# Patient Record
Sex: Female | Born: 1935 | Race: White | Hispanic: No | Smoking: Never smoker
Health system: Southern US, Community
[De-identification: ages and names within clinical notes are randomized; demographics above are authoritative.]

## PROBLEM LIST (undated history)

## (undated) DIAGNOSIS — M1990 Unspecified osteoarthritis, unspecified site: Secondary | ICD-10-CM

## (undated) DIAGNOSIS — R7302 Impaired glucose tolerance (oral): Secondary | ICD-10-CM

## (undated) DIAGNOSIS — E669 Obesity, unspecified: Secondary | ICD-10-CM

## (undated) DIAGNOSIS — T7840XA Allergy, unspecified, initial encounter: Secondary | ICD-10-CM

## (undated) DIAGNOSIS — E785 Hyperlipidemia, unspecified: Secondary | ICD-10-CM

## (undated) DIAGNOSIS — C44311 Basal cell carcinoma of skin of nose: Secondary | ICD-10-CM

## (undated) DIAGNOSIS — C50919 Malignant neoplasm of unspecified site of unspecified female breast: Secondary | ICD-10-CM

## (undated) DIAGNOSIS — N200 Calculus of kidney: Secondary | ICD-10-CM

## (undated) DIAGNOSIS — D0512 Intraductal carcinoma in situ of left breast: Secondary | ICD-10-CM

## (undated) DIAGNOSIS — I10 Essential (primary) hypertension: Secondary | ICD-10-CM

## (undated) HISTORY — DX: Impaired glucose tolerance (oral): R73.02

## (undated) HISTORY — DX: Obesity, unspecified: E66.9

## (undated) HISTORY — PX: HAMMER TOE SURGERY: SHX385

## (undated) HISTORY — DX: Intraductal carcinoma in situ of left breast: D05.12

## (undated) HISTORY — DX: Essential (primary) hypertension: I10

## (undated) HISTORY — PX: BUNIONECTOMY: SHX129

## (undated) HISTORY — PX: CATARACT EXTRACTION, BILATERAL: SHX1313

## (undated) HISTORY — DX: Unspecified osteoarthritis, unspecified site: M19.90

## (undated) HISTORY — DX: Allergy, unspecified, initial encounter: T78.40XA

## (undated) HISTORY — DX: Hyperlipidemia, unspecified: E78.5

## (undated) HISTORY — DX: Calculus of kidney: N20.0

## (undated) HISTORY — DX: Malignant neoplasm of unspecified site of unspecified female breast: C50.919

## (undated) HISTORY — PX: COLONOSCOPY: SHX174

## (undated) HISTORY — DX: Basal cell carcinoma of skin of nose: C44.311

---

## 1996-04-28 HISTORY — PX: CARDIAC CATHETERIZATION: SHX172

## 1996-04-28 HISTORY — PX: DILATION AND CURETTAGE OF UTERUS: SHX78

## 1997-11-09 ENCOUNTER — Other Ambulatory Visit: Admission: RE | Admit: 1997-11-09 | Discharge: 1997-11-09 | Payer: Self-pay | Admitting: Family Medicine

## 1998-01-29 ENCOUNTER — Other Ambulatory Visit: Admission: RE | Admit: 1998-01-29 | Discharge: 1998-01-29 | Payer: Self-pay | Admitting: Obstetrics and Gynecology

## 1999-01-30 ENCOUNTER — Other Ambulatory Visit: Admission: RE | Admit: 1999-01-30 | Discharge: 1999-01-30 | Payer: Self-pay | Admitting: *Deleted

## 2000-02-05 ENCOUNTER — Encounter: Payer: Self-pay | Admitting: Family Medicine

## 2000-02-05 ENCOUNTER — Encounter: Admission: RE | Admit: 2000-02-05 | Discharge: 2000-02-05 | Payer: Self-pay | Admitting: Family Medicine

## 2001-02-09 ENCOUNTER — Encounter: Payer: Self-pay | Admitting: Family Medicine

## 2001-02-09 ENCOUNTER — Encounter: Admission: RE | Admit: 2001-02-09 | Discharge: 2001-02-09 | Payer: Self-pay | Admitting: Family Medicine

## 2002-02-10 ENCOUNTER — Encounter: Admission: RE | Admit: 2002-02-10 | Discharge: 2002-02-10 | Payer: Self-pay | Admitting: Family Medicine

## 2002-02-10 ENCOUNTER — Encounter: Payer: Self-pay | Admitting: Family Medicine

## 2003-02-13 ENCOUNTER — Encounter: Payer: Self-pay | Admitting: Family Medicine

## 2003-02-13 ENCOUNTER — Encounter: Admission: RE | Admit: 2003-02-13 | Discharge: 2003-02-13 | Payer: Self-pay | Admitting: Family Medicine

## 2004-07-31 ENCOUNTER — Encounter: Admission: RE | Admit: 2004-07-31 | Discharge: 2004-07-31 | Payer: Self-pay | Admitting: Family Medicine

## 2004-08-14 ENCOUNTER — Encounter: Admission: RE | Admit: 2004-08-14 | Discharge: 2004-08-14 | Payer: Self-pay | Admitting: Family Medicine

## 2005-04-28 LAB — HM COLONOSCOPY: HM Colonoscopy: NORMAL

## 2005-08-01 ENCOUNTER — Encounter: Admission: RE | Admit: 2005-08-01 | Discharge: 2005-08-01 | Payer: Self-pay | Admitting: Family Medicine

## 2005-09-09 ENCOUNTER — Encounter: Admission: RE | Admit: 2005-09-09 | Discharge: 2005-09-09 | Payer: Self-pay | Admitting: Family Medicine

## 2005-10-31 ENCOUNTER — Ambulatory Visit: Payer: Self-pay | Admitting: Family Medicine

## 2006-02-17 ENCOUNTER — Ambulatory Visit: Payer: Self-pay | Admitting: Family Medicine

## 2006-08-04 ENCOUNTER — Encounter: Admission: RE | Admit: 2006-08-04 | Discharge: 2006-08-04 | Payer: Self-pay | Admitting: Family Medicine

## 2006-11-04 ENCOUNTER — Ambulatory Visit: Payer: Self-pay | Admitting: Family Medicine

## 2007-02-22 ENCOUNTER — Ambulatory Visit: Payer: Self-pay | Admitting: Family Medicine

## 2007-08-06 ENCOUNTER — Encounter: Admission: RE | Admit: 2007-08-06 | Discharge: 2007-08-06 | Payer: Self-pay | Admitting: Family Medicine

## 2007-11-10 ENCOUNTER — Ambulatory Visit: Payer: Self-pay | Admitting: Family Medicine

## 2008-01-12 ENCOUNTER — Ambulatory Visit: Payer: Self-pay | Admitting: Family Medicine

## 2008-08-07 ENCOUNTER — Encounter: Admission: RE | Admit: 2008-08-07 | Discharge: 2008-08-07 | Payer: Self-pay | Admitting: Family Medicine

## 2008-11-10 ENCOUNTER — Ambulatory Visit: Payer: Self-pay | Admitting: Family Medicine

## 2008-12-14 ENCOUNTER — Ambulatory Visit: Payer: Self-pay | Admitting: Family Medicine

## 2009-01-15 ENCOUNTER — Ambulatory Visit: Payer: Self-pay | Admitting: Family Medicine

## 2009-02-14 ENCOUNTER — Ambulatory Visit: Payer: Self-pay | Admitting: Family Medicine

## 2009-02-14 ENCOUNTER — Encounter: Admission: RE | Admit: 2009-02-14 | Discharge: 2009-02-14 | Payer: Self-pay | Admitting: Family Medicine

## 2009-08-13 ENCOUNTER — Encounter: Admission: RE | Admit: 2009-08-13 | Discharge: 2009-08-13 | Payer: Self-pay | Admitting: Family Medicine

## 2009-09-23 ENCOUNTER — Emergency Department (HOSPITAL_COMMUNITY): Admission: EM | Admit: 2009-09-23 | Discharge: 2009-09-23 | Payer: Self-pay | Admitting: Emergency Medicine

## 2009-11-12 ENCOUNTER — Ambulatory Visit: Payer: Self-pay | Admitting: Family Medicine

## 2010-02-07 ENCOUNTER — Ambulatory Visit: Payer: Self-pay | Admitting: Family Medicine

## 2010-05-16 ENCOUNTER — Ambulatory Visit: Admit: 2010-05-16 | Payer: Self-pay | Admitting: Family Medicine

## 2010-05-19 ENCOUNTER — Encounter: Payer: Self-pay | Admitting: Family Medicine

## 2010-05-27 ENCOUNTER — Other Ambulatory Visit: Payer: Self-pay | Admitting: Family Medicine

## 2010-05-27 DIAGNOSIS — Z780 Asymptomatic menopausal state: Secondary | ICD-10-CM

## 2010-05-27 DIAGNOSIS — M8580 Other specified disorders of bone density and structure, unspecified site: Secondary | ICD-10-CM

## 2010-05-31 ENCOUNTER — Ambulatory Visit
Admission: RE | Admit: 2010-05-31 | Discharge: 2010-05-31 | Disposition: A | Discharge status: Home / Self Care | Payer: MEDICARE | Source: Ambulatory Visit | Attending: Family Medicine | Admitting: Family Medicine

## 2010-05-31 DIAGNOSIS — M8580 Other specified disorders of bone density and structure, unspecified site: Secondary | ICD-10-CM

## 2010-05-31 DIAGNOSIS — Z780 Asymptomatic menopausal state: Secondary | ICD-10-CM

## 2010-06-03 ENCOUNTER — Ambulatory Visit (INDEPENDENT_AMBULATORY_CARE_PROVIDER_SITE_OTHER): Payer: MEDICARE | Admitting: Family Medicine

## 2010-06-03 DIAGNOSIS — R946 Abnormal results of thyroid function studies: Secondary | ICD-10-CM

## 2010-07-16 ENCOUNTER — Other Ambulatory Visit: Payer: Self-pay | Admitting: Family Medicine

## 2010-07-16 DIAGNOSIS — Z1231 Encounter for screening mammogram for malignant neoplasm of breast: Secondary | ICD-10-CM

## 2010-07-18 IMAGING — CR DG LUMBAR SPINE COMPLETE 4+V
5 series · 5 of 5 positions shown · non-contrast
Comparison: None.

CLINICAL DATA: Back pain status post fall.

LUMBAR SPINE - COMPLETE 4+ VIEW

[t l-spine a.p.]
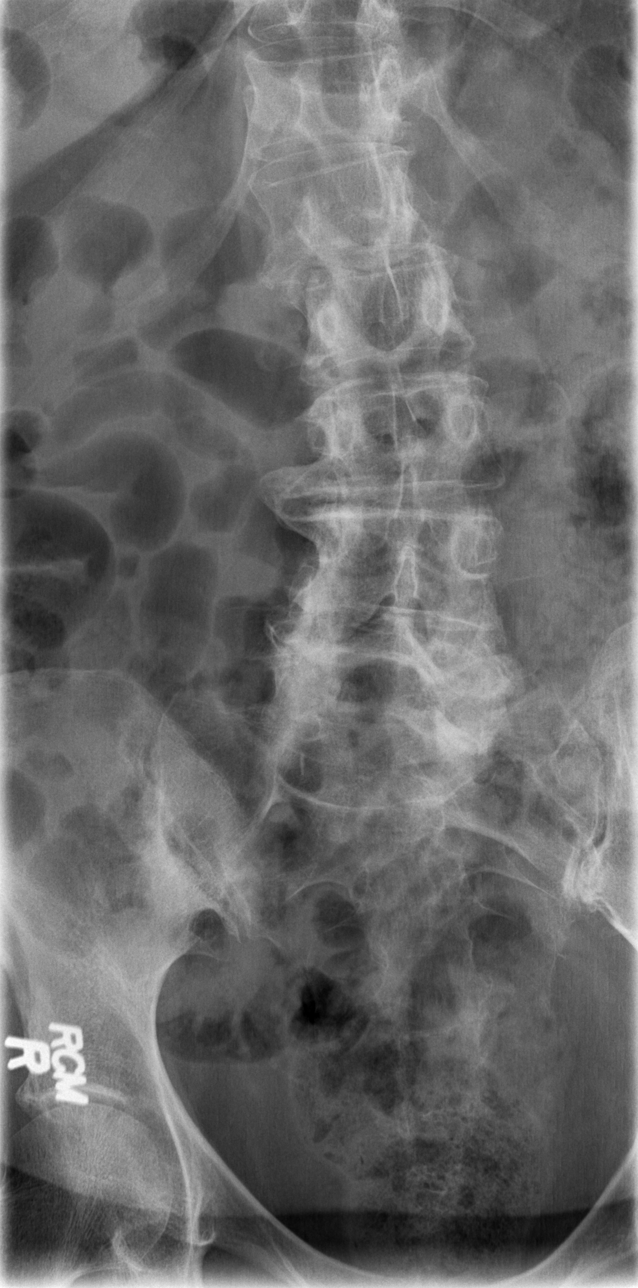

[t l-spine oblique exposure (1 of 2)]
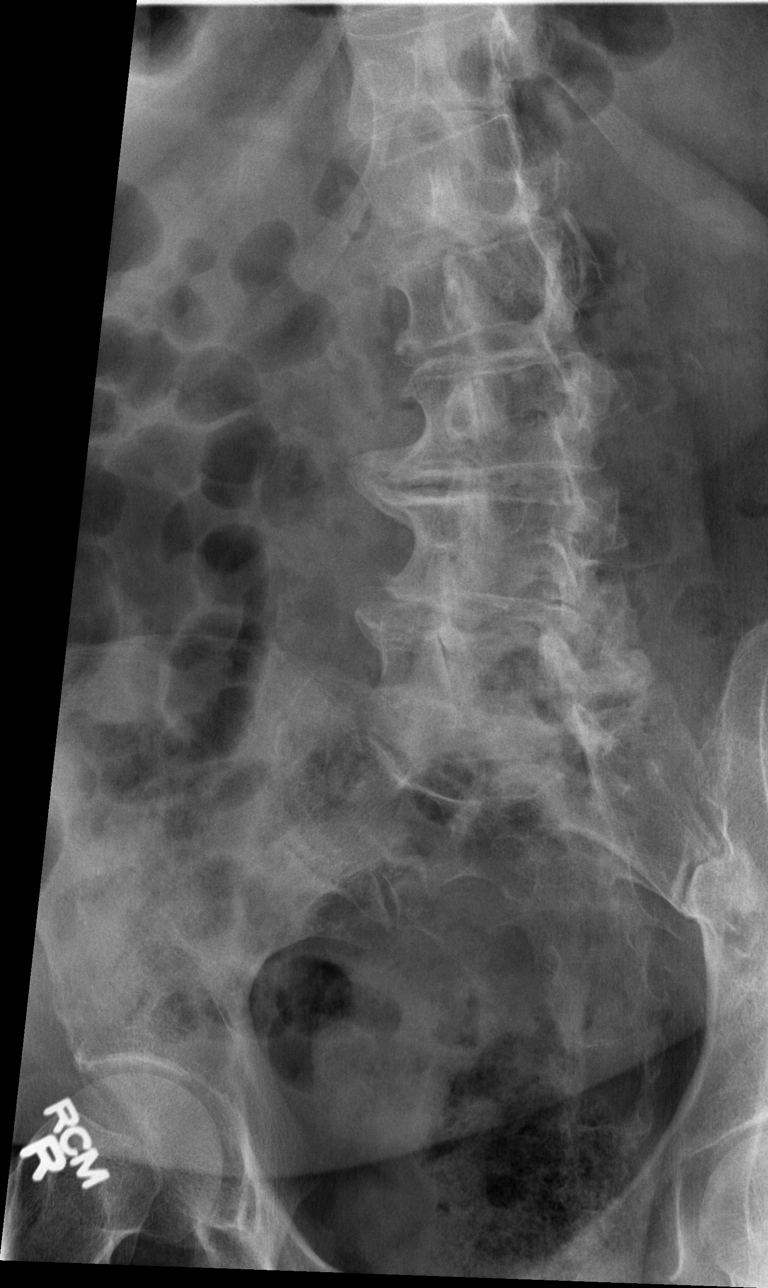

[t l-spine oblique exposure (2 of 2)]
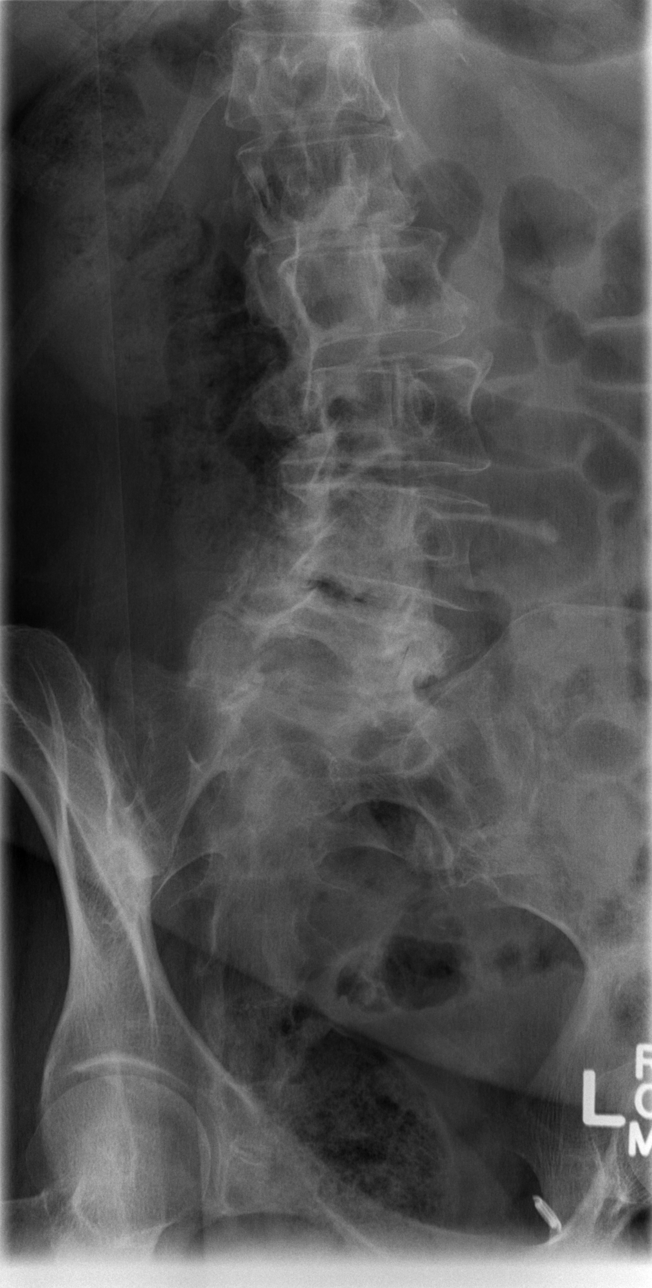

[t l-spine lat]
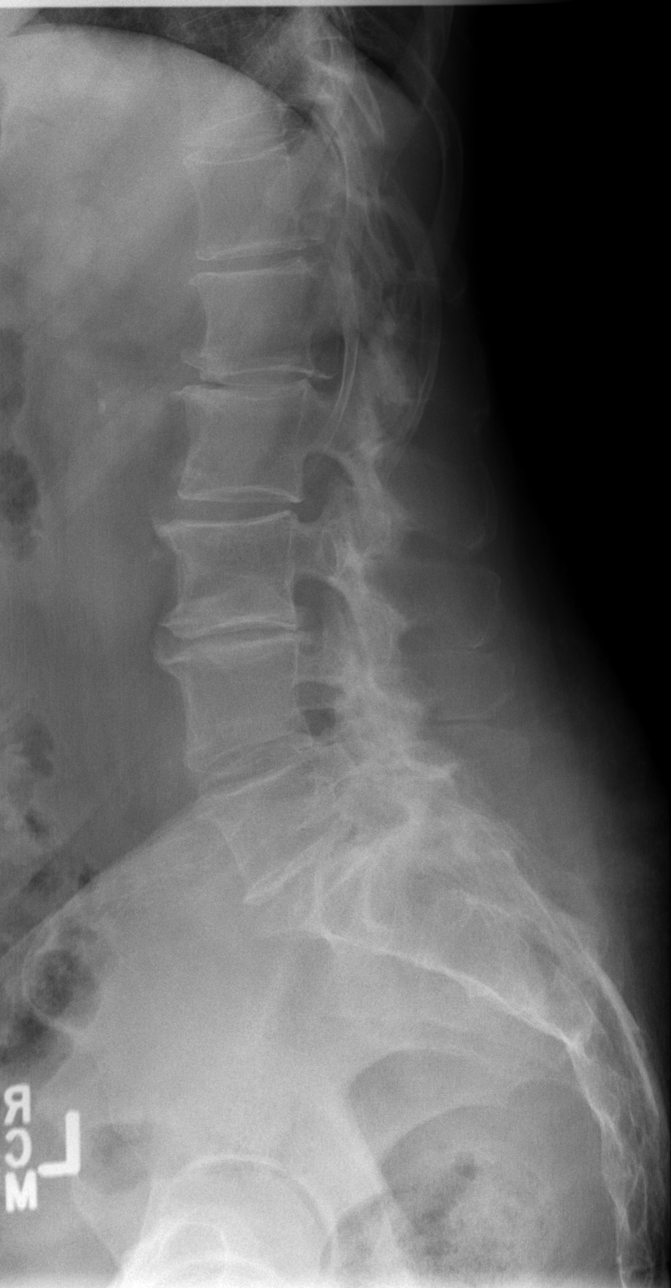

[t l-spine l5-s1 spot]
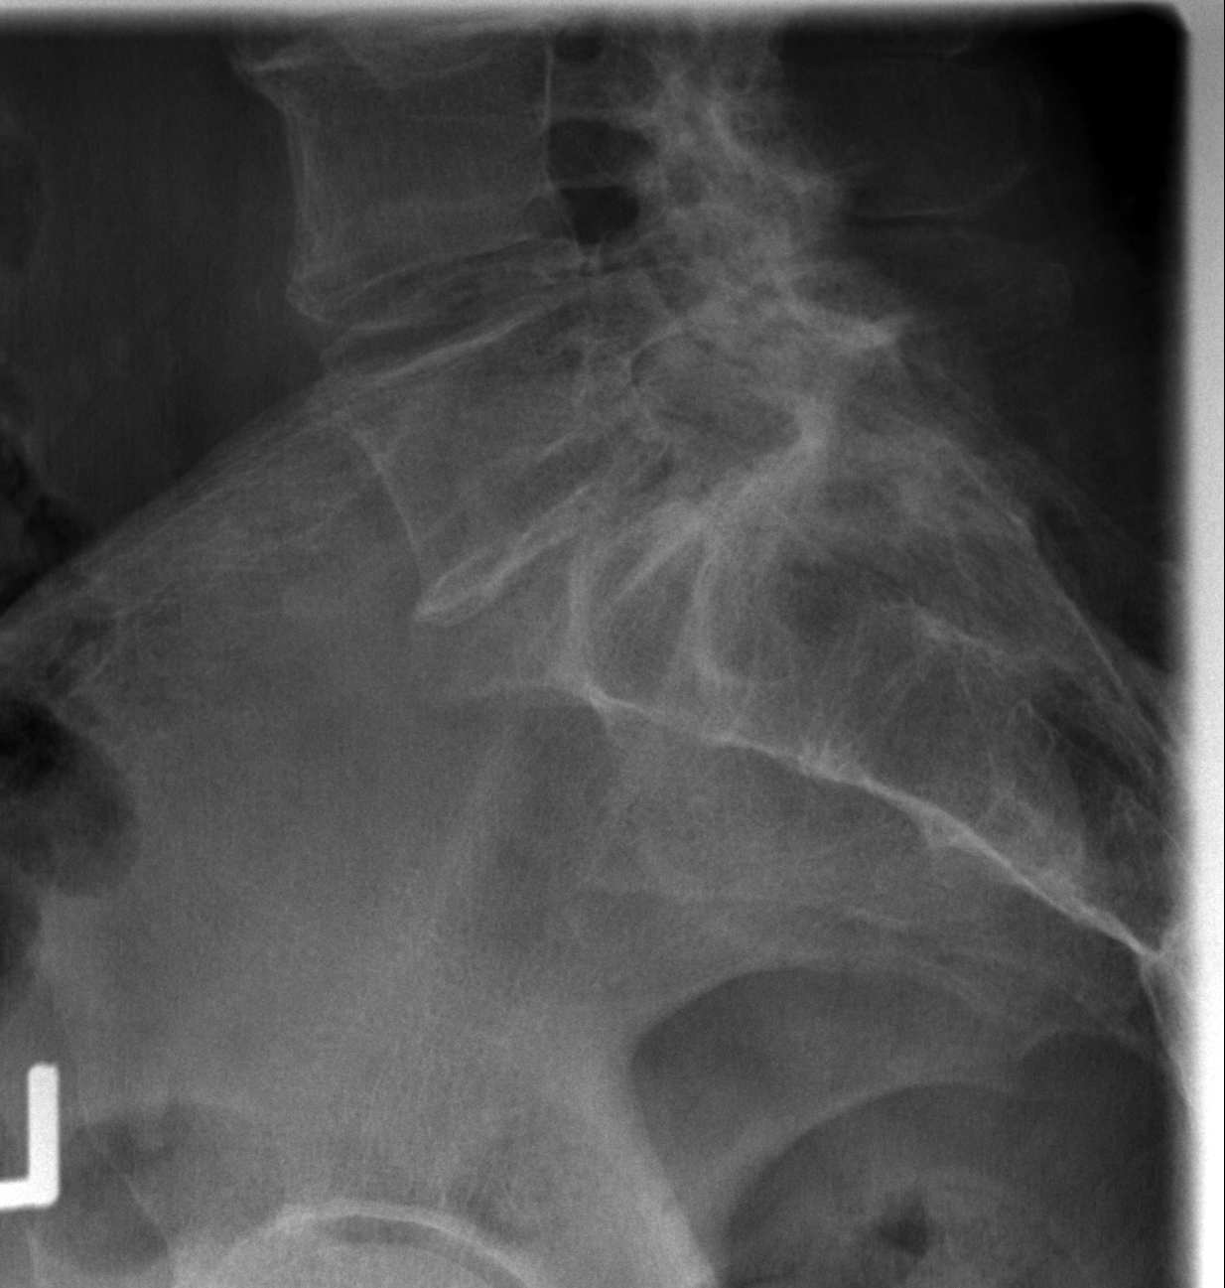

[5 of 5 positions shown; findings below may reference images not displayed]

FINDINGS: There are five lumbar type vertebral bodies.  There is a
convex left scoliosis.  The lateral alignment is near anatomic.
There is multilevel spondylosis with advanced disc space loss,
osteophytes and facet hypertrophy.  No acute fractures are
identified. There is diffuse osteopenia.
IMPRESSION: Multilevel spondylosis.  No acute osseous findings.

## 2010-07-18 IMAGING — CR DG HUMERUS 2V *R*
2 series · 2 of 2 positions shown · non-contrast
Comparison: None relevant.

CLINICAL DATA: Status post fall.  Mid humeral pain.

RIGHT HUMERUS - 2+ VIEW

[w humerus ap right *]
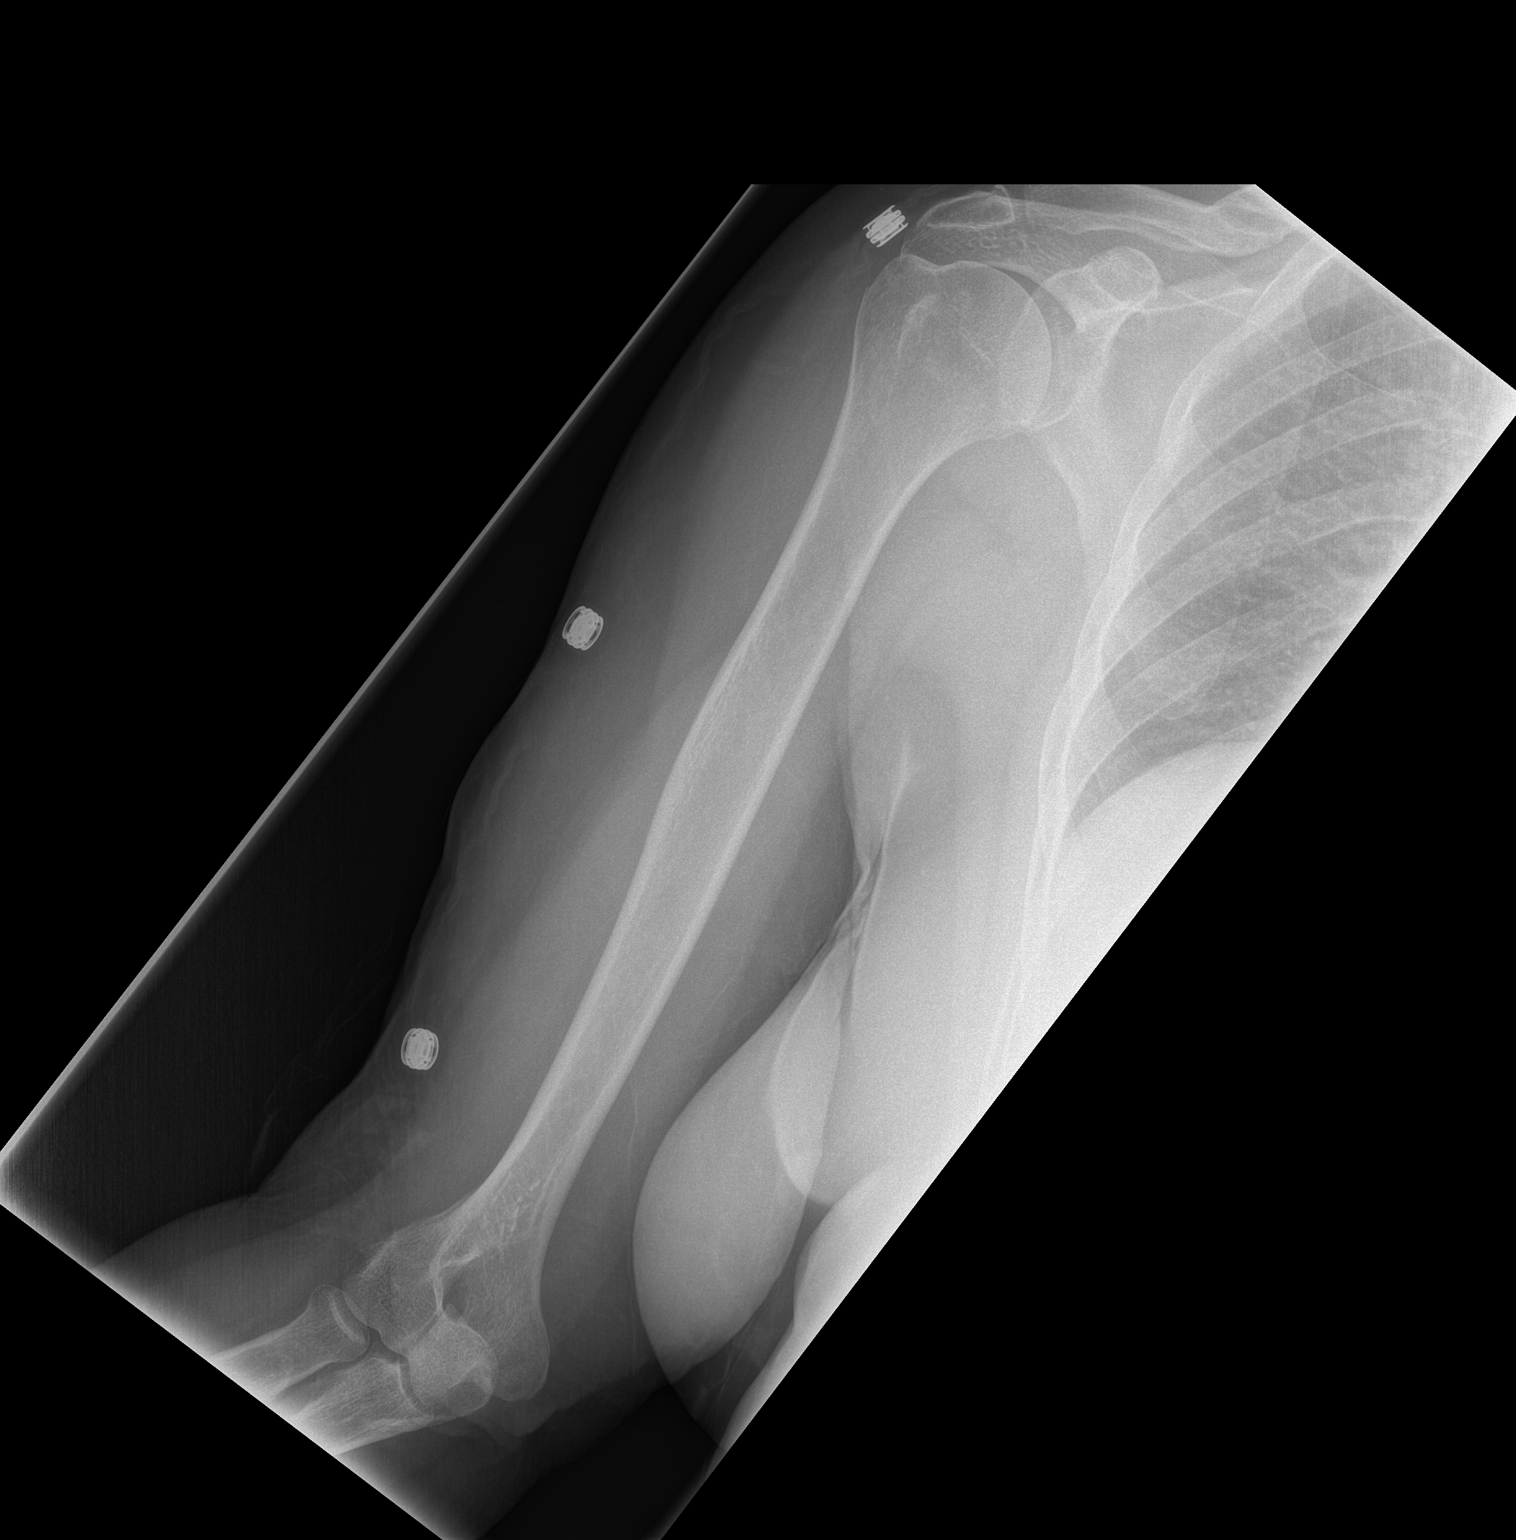

[w shoulder ap external righ]
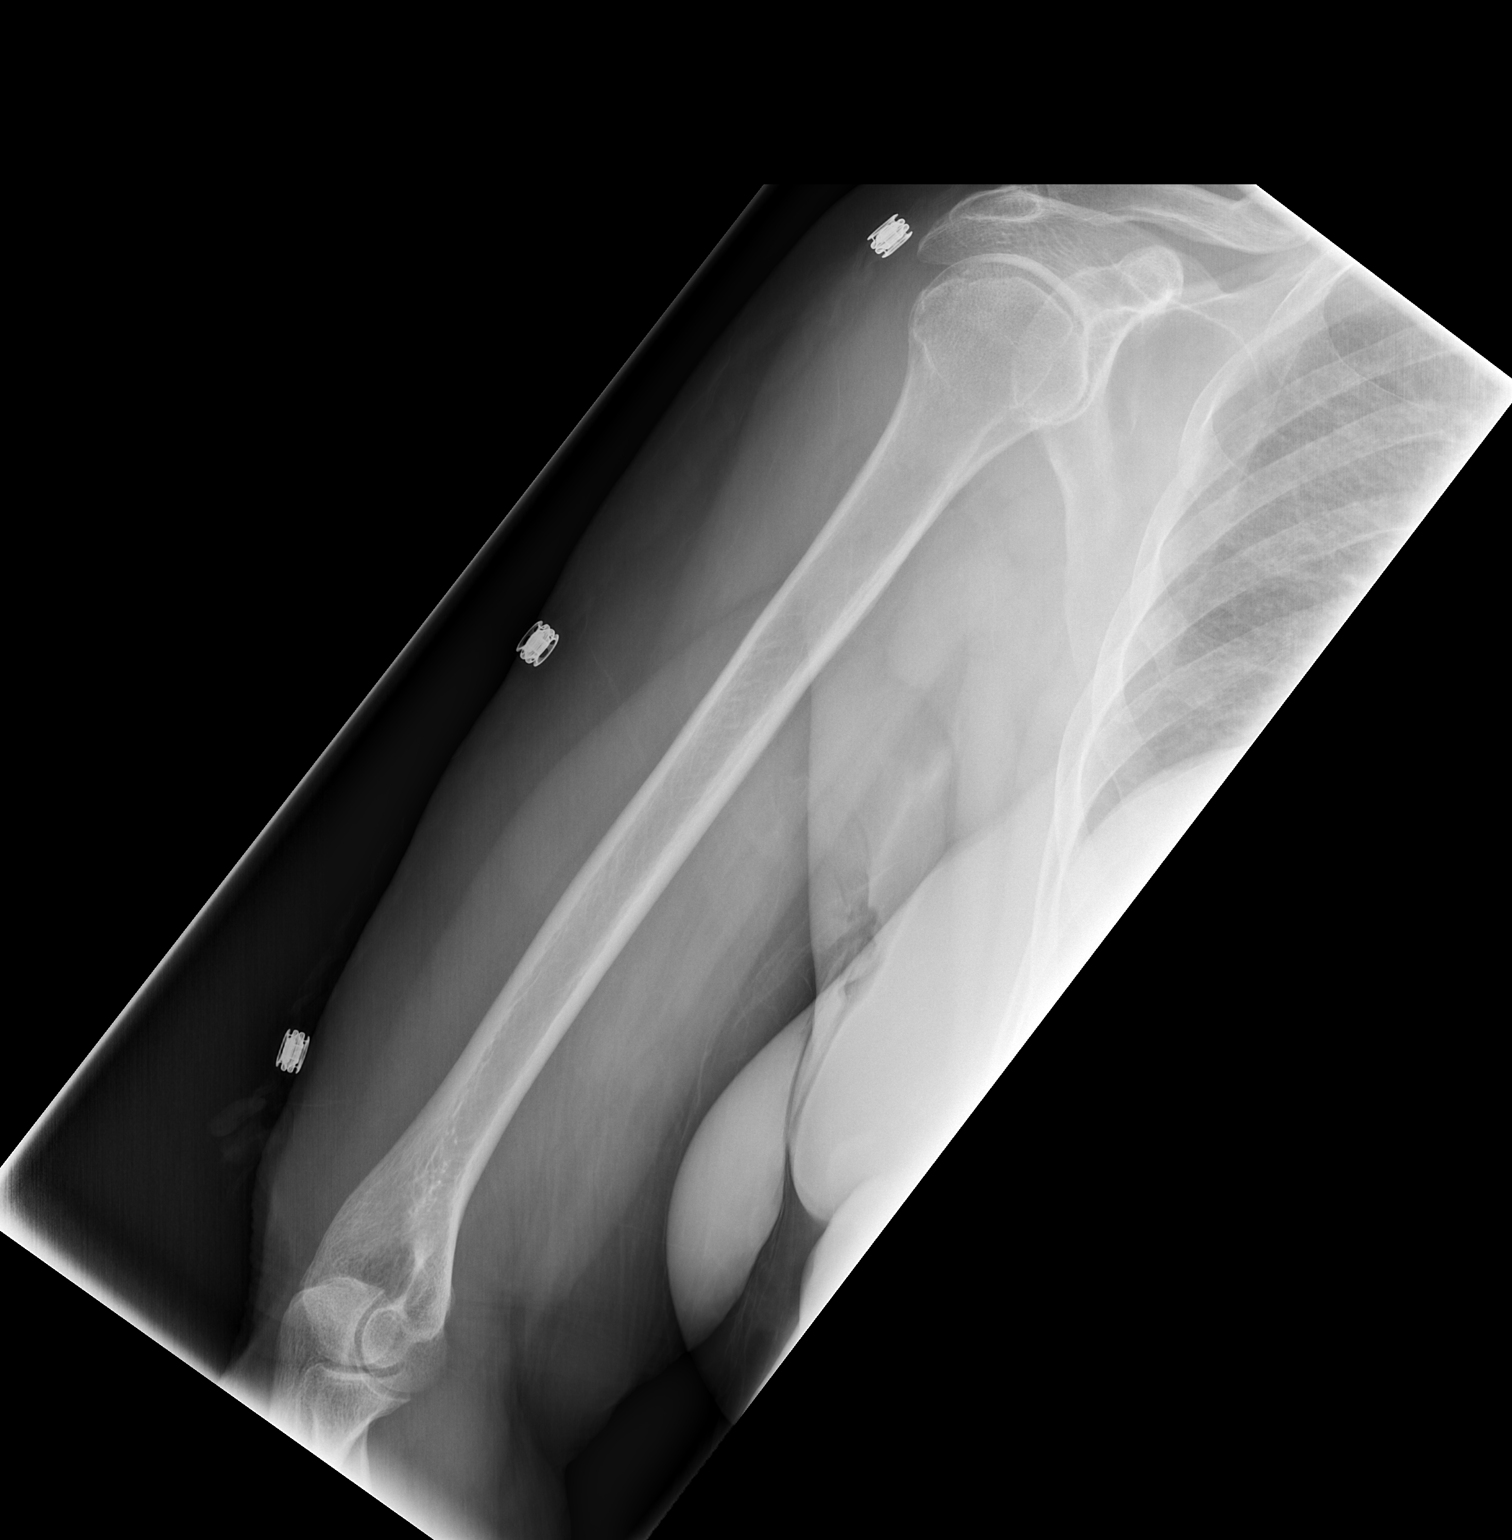

[2 of 2 positions shown; findings below may reference images not displayed]

FINDINGS: There is no evidence of acute fracture or dislocation of
the humerus.  The subacromial space appears preserved at the
shoulder.  There is deformity of the distal right clavicle without
sharp margins, most likely reflecting an old fracture.  Correlate
clinically.
IMPRESSION: Negative radiographs of the right upper arm.  Probable old fracture
of the distal right clavicle - correlate clinically.

## 2010-08-15 ENCOUNTER — Ambulatory Visit
Admission: RE | Admit: 2010-08-15 | Discharge: 2010-08-15 | Disposition: A | Discharge status: Home / Self Care | Payer: MEDICARE | Source: Ambulatory Visit | Attending: Family Medicine | Admitting: Family Medicine

## 2010-08-15 DIAGNOSIS — Z1231 Encounter for screening mammogram for malignant neoplasm of breast: Secondary | ICD-10-CM

## 2010-09-19 ENCOUNTER — Inpatient Hospital Stay (HOSPITAL_COMMUNITY)
Admission: EM | Admit: 2010-09-19 | Discharge: 2010-09-20 | DRG: 313 | Disposition: A | Discharge status: Home / Self Care | Payer: Medicare Other | Attending: Internal Medicine | Admitting: Internal Medicine

## 2010-09-19 ENCOUNTER — Emergency Department (HOSPITAL_COMMUNITY): Payer: Medicare Other

## 2010-09-19 ENCOUNTER — Inpatient Hospital Stay (INDEPENDENT_AMBULATORY_CARE_PROVIDER_SITE_OTHER)
Admission: RE | Admit: 2010-09-19 | Discharge: 2010-09-19 | Disposition: A | Discharge status: Home / Self Care | Payer: Medicare Other | Source: Ambulatory Visit | Attending: Family Medicine | Admitting: Family Medicine

## 2010-09-19 DIAGNOSIS — I10 Essential (primary) hypertension: Secondary | ICD-10-CM | POA: Diagnosis present

## 2010-09-19 DIAGNOSIS — R0789 Other chest pain: Principal | ICD-10-CM | POA: Diagnosis present

## 2010-09-19 DIAGNOSIS — E7800 Pure hypercholesterolemia, unspecified: Secondary | ICD-10-CM | POA: Diagnosis present

## 2010-09-19 DIAGNOSIS — R079 Chest pain, unspecified: Secondary | ICD-10-CM

## 2010-09-19 DIAGNOSIS — R109 Unspecified abdominal pain: Secondary | ICD-10-CM

## 2010-09-19 DIAGNOSIS — M1990 Unspecified osteoarthritis, unspecified site: Secondary | ICD-10-CM | POA: Diagnosis present

## 2010-09-19 DIAGNOSIS — E785 Hyperlipidemia, unspecified: Secondary | ICD-10-CM | POA: Diagnosis present

## 2010-09-19 DIAGNOSIS — E876 Hypokalemia: Secondary | ICD-10-CM | POA: Diagnosis present

## 2010-09-19 DIAGNOSIS — Z8249 Family history of ischemic heart disease and other diseases of the circulatory system: Secondary | ICD-10-CM

## 2010-09-19 DIAGNOSIS — Z7982 Long term (current) use of aspirin: Secondary | ICD-10-CM

## 2010-09-19 DIAGNOSIS — E669 Obesity, unspecified: Secondary | ICD-10-CM | POA: Diagnosis present

## 2010-09-19 LAB — TROPONIN I: Troponin I: <0.3 ng/mL (ref <0.30)

## 2010-09-19 LAB — LIPASE, BLOOD: Lipase: 16 U/L (ref 11–59)

## 2010-09-19 LAB — CBC
MCH: 27.6 pg (ref 26.0–34.0)
Platelets: 184 10*3/uL (ref 150–400)
RBC: 4.86 MIL/uL (ref 3.87–5.11)
RDW: 13 % (ref 11.5–15.5)

## 2010-09-19 LAB — COMPREHENSIVE METABOLIC PANEL
ALT: 15 U/L (ref 0–35)
GFR calc Af Amer: >60 mL/min (ref >60)
GFR calc non Af Amer: >60 mL/min (ref >60)

## 2010-09-19 LAB — POCT CARDIAC MARKERS
CKMB, poc: 1.1 ng/mL (ref 1.0–8.0)
Troponin i, poc: <0.05 ng/mL (ref 0.00–0.09)

## 2010-09-19 LAB — CK TOTAL AND CKMB (NOT AT ARMC)
CK, MB: 2.1 ng/mL (ref 0.3–4.0)
Relative Index: 1.4 (ref 0.0–2.5)
Total CK: 155 U/L (ref 7–177)

## 2010-09-19 LAB — DIFFERENTIAL
Eosinophils Absolute: 0 10*3/uL (ref 0.0–0.7)
Eosinophils Relative: 0 % (ref 0–5)
Lymphocytes Relative: 9 % — ABNORMAL LOW (ref 12–46)
Lymphs Abs: 1.5 10*3/uL (ref 0.7–4.0)
Monocytes Absolute: 1.1 10*3/uL — ABNORMAL HIGH (ref 0.1–1.0)
Neutrophils Relative %: 85 % — ABNORMAL HIGH (ref 43–77)

## 2010-09-20 LAB — CARDIAC PANEL(CRET KIN+CKTOT+MB+TROPI)
CK, MB: 2.8 ng/mL (ref 0.3–4.0)
CK, MB: 3 ng/mL (ref 0.3–4.0)
Relative Index: 1.1 (ref 0.0–2.5)
Relative Index: 0.9 (ref 0.0–2.5)
Total CK: 254 U/L — ABNORMAL HIGH (ref 7–177)
Troponin I: <0.3 ng/mL (ref <0.30)

## 2010-09-20 LAB — TSH: TSH: 1.701 u[IU]/mL (ref 0.350–4.500)

## 2010-09-20 LAB — HEMOGLOBIN A1C
Hgb A1c MFr Bld: 5.8 % — ABNORMAL HIGH (ref <5.7)
Mean Plasma Glucose: 120 mg/dL — ABNORMAL HIGH (ref <117)

## 2010-09-20 LAB — LIPID PANEL
Cholesterol: 140 mg/dL (ref 0–200)
LDL Cholesterol: 71 mg/dL (ref 0–99)

## 2010-09-20 LAB — BASIC METABOLIC PANEL: Sodium: 142 mEq/L (ref 135–145)

## 2010-09-25 ENCOUNTER — Ambulatory Visit (HOSPITAL_COMMUNITY): Payer: Medicare Other | Attending: Internal Medicine | Admitting: Radiology

## 2010-09-25 DIAGNOSIS — R9431 Abnormal electrocardiogram [ECG] [EKG]: Secondary | ICD-10-CM

## 2010-09-25 DIAGNOSIS — R079 Chest pain, unspecified: Secondary | ICD-10-CM | POA: Insufficient documentation

## 2010-09-25 MED ORDER — TECHNETIUM TC 99M TETROFOSMIN IV KIT
33.0000 | PACK | Freq: Once | INTRAVENOUS | Status: AC | PRN
  Administered 2010-09-25: 33 via INTRAVENOUS

## 2010-09-25 MED ORDER — TECHNETIUM TC 99M TETROFOSMIN IV KIT
11.0000 | PACK | Freq: Once | INTRAVENOUS | Status: AC | PRN
  Administered 2010-09-25: 11 via INTRAVENOUS

## 2010-09-25 MED ORDER — REGADENOSON 0.4 MG/5ML IV SOLN
0.4000 mg | Freq: Once | INTRAVENOUS | Status: AC
  Administered 2010-09-25: 0.4 mg via INTRAVENOUS

## 2010-09-25 NOTE — H&P (Signed)
Alexandra Bowen, Alexandra Bowen                ACCOUNT NO.:  [id number]  MEDICAL RECORD NO.:  [id number]           PATIENT TYPE:  I  LOCATION:  3701                         FACILITY:  [facility]  PHYSICIAN:  Bevelyn Buckles. Audrianna Driskill, MDDATE OF BIRTH:  June 06, 1935  DATE OF ADMISSION:  09/19/2010 DATE OF DISCHARGE:                             HISTORY & PHYSICAL   PRIMARY CARE PHYSICIAN:  Sharlot Gowda, MD  PRIMARY CARDIOLOGIST:  Peter M. Swaziland, MD  CHIEF COMPLAINT:  Chest pain.  HISTORY OF PRESENT ILLNESS:  Alexandra Bowen is a 75 year old female with no history of coronary artery disease.  She had epigastric pain and tightness that started at 5 p.m. on Sep 18, 2010.  It reached a 9-10/10. It was accompanied by chills and nausea.  It was a little bit worse with deep inspiration.  She took 2 Pepto-Bismol tablets without relief.  She had no shortness of breath, lightheadedness, diaphoresis, or palpitations.  It did not radiate.  She slept poorly because the discomfort was there all night long.  Today, she went to Urgent Care when her symptoms had not resolved and they sent her to the emergency room because her EKG was abnormal.  She experienced several episodes of nausea with dry heaves, but no diarrhea.  She received aspirin and potassium and is currently pain free.  She does not ever recall having pain like this before.  Currently, she is resting more comfortably.  PAST MEDICAL HISTORY: 1. Status post cardiac catheterization in 1998 with reportedly no     evidence of coronary artery disease, no further details available. 2. Hypertension. 3. Hyperlipidemia. 4. History of hiatal hernia. 5. Degenerative joint disease. 6. Obesity.  SURGICAL HISTORY:  She is status post cardiac catheterization as well as D and C.  ALLERGIES:  No known drug allergies.  CURRENT MEDICATIONS: 1. Amlodipine 5 mg a day. 2. Aspirin 81 mg a day. 3. Bisoprolol/hydrochlorothiazide 10/6.25 mg a day. 4. Zocor 20 mg a day. 5.  Multivitamin daily. 6. Calcium plus D daily.  SOCIAL HISTORY:  She lives in [place] alone.  She is retired from [employer].  She has one stepson and is a widow.  She has no history of alcohol, tobacco, or drug abuse.  She stays active with walking and yard work and feels that she eats a well-balanced diet.  FAMILY HISTORY:  Both of her parents are deceased and neither one had a history of coronary artery disease, but six of her siblings have a history of coronary artery disease or stroke.  REVIEW OF SYSTEMS:  She has rare reflux symptoms.  She denies melena. She has no documented fever, but had chills with the chest pain.  She denies dyspnea on exertion, orthopnea, PND, or edema.  She has not had palpitations.  She has not been coughing or wheezing and denies dysuria. Full 14-point review of systems is otherwise negative except as stated in the HPI.  PHYSICAL EXAMINATION:  VITAL SIGNS:  Temperature 98.6, blood pressure 128/55, heart rate 66, respiratory rate 16, and O2 saturation 99% on room air. GENERAL:  She is a well-developed, well-nourished white female in no acute  distress. HEENT:  Normal. NECK:  There is no lymphadenopathy, thyromegaly, bruit, or JVD noted. CARDIOVASCULAR:  Heart is regular in rate and rhythm with an S1 and S2 and no significant murmur, rub, or gallop is noted.  Distal pulses are intact in all 4 extremities. LUNGS:  She has a few expiratory crackles in the right lower lobe that clear with deep inspiration and cough. SKIN:  No rashes or lesions are noted. ABDOMEN:  Soft and nontender with active bowel sounds. EXTREMITIES:  There is no cyanosis, clubbing, or edema noted. MUSCULOSKELETAL:  There is no joint deformity or effusions and no spine or CVA tenderness. NEURO:  She is alert and oriented with cranial nerves II-XII grossly intact.  Chest x-ray shows mildly enlarged heart silhouette and ectatic aorta, but no acute cardiopulmonary process.  EKG:   Sinus rhythm, rate 72 with diffuse inferolateral T-wave flattening and normal intervals.  LABORATORY VALUES:  Hemoglobin 13.4, hematocrit 39.6, WBC 16.9, and platelets 184.  Sodium 138, potassium 3.1, chloride 100, CO2 of 28, BUN 15, creatinine 0.66, and glucose 135.  Lipase 16.  Point-of-care markers negative x1.  IMPRESSION:  Alexandra Bowen was seen today by Dr. Gala Romney, the patient evaluated and the data reviewed.  Her chest pain is mostly atypical and she has a greater than 12 hours of continuous chest pain with initially negative cardiac enzymes.  However, she has multiple cardiac risk factors.  We will observe overnight and because of her family history of coronary artery disease in multiple siblings, we will obtain an inpatient Myoview in the a.m. if her cardiac enzymes remain negative. She will also be started on a proton pump inhibitor.  Her potassium has been supplemented and we will recheck it in the a.m.  She will be followed closely overnight.    [name], PA-C   ______________________________ Bevelyn Buckles. Hallee Mckenny, MD   RB/MEDQ  D:  09/19/2010  T:  09/20/2010  Job:  [id number]  Electronically Signed by [name] PA-C on 09/25/2010 11:30:30 AM Electronically Signed by Arvilla Meres MD on 09/25/2010 10:43:48 PM

## 2010-09-25 NOTE — Progress Notes (Signed)
[name] SITE 3 NUCLEAR MED [address] [place] [address] [phone]  Cardiology Nuclear Med Study  Alexandra Bowen is a 75 y.o. female [id number] 02-Aug-1935   Nuclear Med Background Indication for Stress Test:  Evaluation for Ischemia and 09/19/10 Post Hospital: CP, (-) enzymes History: '98 Heart Catheterization Cardiac Risk Factors: Family History - CAD, Hypertension and Lipids  Symptoms:  Chest Pain   Nuclear Pre-Procedure Caffeine/Decaff Intake:  None NPO After: 7:00pm   Lungs:  clear IV 0.9% NS with Angio Cath:  20g  IV Site: R Hand  IV Started by:  Stanton Kidney, EMT-P  Chest Size (in):  44 Cup Size: D  Height: 5\' 6"  (1.676 m)  Weight:  196 lb (88.905 kg)  BMI:  Body mass index is 31.64 kg/(m^2). Tech Comments:  Patient has not started Bisoprolol yet.    Nuclear Med Study 1 or 2 day study: 1 day  Stress Test Type:  Eugenie Birks  Reading MD: Kristeen Miss, MD  Order Authorizing Provider:  P. [place], MD  Resting Radionuclide: Technetium 59m Tetrofosmin  Resting Radionuclide Dose: 11 mCi   Stress Radionuclide:  Technetium 43m Tetrofosmin  Stress Radionuclide Dose: 33 mCi           Stress Protocol Rest HR: 64 Stress HR: 96  Rest BP: 137/67 Stress BP: 144/47  Exercise Time (min): n/a METS: n/a   Predicted Max HR: 148 bpm % Max HR: 64.86 bpm Rate Pressure Product: 01027   Dose of Adenosine (mg):  n/a Dose of Lexiscan: 0.4 mg  Dose of Atropine (mg): n/a Dose of Dobutamine: n/a mcg/kg/min (at max HR)  Stress Test Technologist: Milana Na, EMT-P  Nuclear Technologist:  Domenic Polite, CNMT     Rest Procedure:  Myocardial perfusion imaging was performed at rest 45 minutes following the intravenous administration of Technetium 66m Tetrofosmin. Rest ECG: NSR with non-specific ST-T wave changes  Stress Procedure:  The patient received IV Lexiscan 0.4 mg over 15-seconds.  Technetium 57m Tetrofosmin injected at 30-seconds.  There were no  significant changes with Lexiscan.  Quantitative spect images were obtained after a 45 minute delay. Stress ECG: There are TWI in the anterior lateral leads - similar to the baseline ECG.  QPS Raw Data Images:  Normal; no motion artifact; normal heart/lung ratio. Stress Images:  Normal homogeneous uptake in all areas of the myocardium. Rest Images:  Normal homogeneous uptake in all areas of the myocardium. Subtraction (SDS):  No evidence of ischemia. Transient Ischemic Dilatation (Normal <1.22):  0.87 Lung/Heart Ratio (Normal <0.45):  0.27  Quantitative Gated Spect Images QGS EDV:  75 ml QGS ESV:  18 ml QGS cine images:  NL LV Function; NL Wall Motion QGS EF: 76%  Impression Exercise Capacity:  Lexiscan with no exercise. BP Response:  Normal blood pressure response. Clinical Symptoms:  No chest pain. ECG Impression:  No significant ST segment change suggestive of ischemia. Comparison with Prior Nuclear Study: No images to compare  Overall Impression:  Normal stress nuclear study.  No evidence of ischemia.  Normal LV function.   Elyn Aquas., MD, [facility]

## 2010-09-26 NOTE — Progress Notes (Signed)
Please report- nuclear stress test is normal.

## 2010-09-26 NOTE — Progress Notes (Signed)
Copy routed to Dr. Jordan.Falecha L Clark ° ° °

## 2010-10-02 NOTE — Discharge Summary (Signed)
  Alexandra Bowen NO.:  [id number]  MEDICAL RECORD NO.:  [id number]           PATIENT TYPE:  I  LOCATION:  3701                         FACILITY:  [facility]  PHYSICIAN:  Peter M. Swaziland, M.D.  DATE OF BIRTH:  Apr 25, 1936  DATE OF ADMISSION:  09/19/2010 DATE OF DISCHARGE:  09/20/2010                              DISCHARGE SUMMARY   PROCEDURES:  Two-view chest x-ray.  PRIMARY FINAL DISCHARGE DIAGNOSIS:  Chest pain, cardiac enzymes negative for myocardial infarction and outpatient Myoview planned.  SECONDARY DIAGNOSES: 1. Hypertension. 2. Hyperlipidemia with a total cholesterol of 140, triglycerides of     63, HDL 56, LDL 71 this admission on medical therapy. 3. History of hiatal hernia. 4. Obesity. 5. Degenerative joint disease. 6. Family history of coronary artery disease and siblings.  TIME AT DISCHARGE:  32 minutes.  HOSPITAL COURSE:  Alexandra Bowen is a 75 year old female with no history of coronary artery disease.  She had chest pain and came to the hospital where she was admitted for further evaluation.  Her chest pain resolved.  Her white count was slightly elevated but she had no fever and a chest x-ray did not show any significant abnormalities.  She was hypokalemic on admission with a potassium of 3.1.  Hemoglobin A1c was 5.8.  She had some hyperglycemia with a blood sugar of 135 and a fasting blood sugar of 104.  She is encouraged to limit carbohydrate and follow up with her family physician.  On Sep 20, 2010, Alexandra Bowen was evaluated by Dr. Swaziland.  No further inpatient workup was indicated and she is considered stable for discharge, to follow up as an outpatient.  DISCHARGE INSTRUCTIONS:  Her activity level is to be increased gradually.  She is encouraged to stick to a low-sodium heart-healthy diet.  She is to follow up with Dr. Swaziland after her stress test.  She will have a stress test arranged prior to discharge.  She is to follow up with  Dr. Susann Givens as needed.  DISCHARGE MEDICATIONS: 1. Bisoprolol/HCTZ 10/6.25 mg daily. 2. Amlodipine 5 mg a day. 3. Zocor 20 mg a day. 4. Multivitamin daily. 5. Aspirin 81 mg a day. 6. Protonix 40 mg a day. 7. Calcium carbonate plus D daily. 8. Potassium 10 mEq daily.     [name], PA-C   ______________________________ Peter M. Swaziland, M.D.    RB/MEDQ  D:  09/20/2010  T:  09/21/2010  Job:  [id number]  cc:   Sharlot Gowda, M.D.  Electronically Signed by [name] PA-C on 09/25/2010 11:30:51 AM Electronically Signed by PETER Swaziland M.D. on 10/02/2010 06:05:40 PM

## 2010-10-04 ENCOUNTER — Telehealth: Payer: Self-pay | Admitting: Internal Medicine

## 2010-10-04 ENCOUNTER — Telehealth: Payer: Self-pay | Admitting: Cardiology

## 2010-10-04 NOTE — Telephone Encounter (Signed)
Pt wants to know her lexican results.

## 2010-10-04 NOTE — Telephone Encounter (Signed)
Patient called wanting to know the results of her stress test. Claims it has been over two weeks since she took it and is concerned. Please call back.

## 2010-10-04 NOTE — Progress Notes (Signed)
Notified of stress test results. Will send copy to Dr. Susann Givens

## 2010-10-07 NOTE — Telephone Encounter (Signed)
Pt aware 6/8 by Dr Elvis Coil office

## 2010-11-19 ENCOUNTER — Encounter: Payer: Self-pay | Admitting: Family Medicine

## 2010-11-22 ENCOUNTER — Ambulatory Visit (INDEPENDENT_AMBULATORY_CARE_PROVIDER_SITE_OTHER): Payer: Medicare Other | Admitting: Family Medicine

## 2010-11-22 ENCOUNTER — Encounter: Payer: Self-pay | Admitting: Family Medicine

## 2010-11-22 VITALS — BP 126/84 | HR 70 | Ht 66.0 in | Wt 197.0 lb

## 2010-11-22 DIAGNOSIS — I10 Essential (primary) hypertension: Secondary | ICD-10-CM

## 2010-11-22 DIAGNOSIS — M1990 Unspecified osteoarthritis, unspecified site: Secondary | ICD-10-CM

## 2010-11-22 DIAGNOSIS — M949 Disorder of cartilage, unspecified: Secondary | ICD-10-CM

## 2010-11-22 DIAGNOSIS — M899 Disorder of bone, unspecified: Secondary | ICD-10-CM

## 2010-11-22 DIAGNOSIS — E876 Hypokalemia: Secondary | ICD-10-CM

## 2010-11-22 DIAGNOSIS — M8580 Other specified disorders of bone density and structure, unspecified site: Secondary | ICD-10-CM | POA: Insufficient documentation

## 2010-11-22 DIAGNOSIS — M129 Arthropathy, unspecified: Secondary | ICD-10-CM

## 2010-11-22 DIAGNOSIS — E785 Hyperlipidemia, unspecified: Secondary | ICD-10-CM | POA: Insufficient documentation

## 2010-11-22 DIAGNOSIS — Z8249 Family history of ischemic heart disease and other diseases of the circulatory system: Secondary | ICD-10-CM

## 2010-11-22 DIAGNOSIS — E669 Obesity, unspecified: Secondary | ICD-10-CM

## 2010-11-22 LAB — ELECTROLYTE PANEL: Sodium: 143 mEq/L (ref 135–145)

## 2010-11-22 MED ORDER — BISOPROLOL-HYDROCHLOROTHIAZIDE 10-6.25 MG PO TABS: 1.0000 | ORAL_TABLET | Freq: Every day | ORAL | Status: DC

## 2010-11-22 MED ORDER — AMLODIPINE BESYLATE 5 MG PO TABS: 5.0000 mg | ORAL_TABLET | Freq: Every day | ORAL | Status: DC

## 2010-11-22 MED ORDER — SIMVASTATIN 20 MG PO TABS: 20.0000 mg | ORAL_TABLET | Freq: Every day | ORAL | Status: DC

## 2010-11-22 NOTE — Progress Notes (Signed)
  Subjective:    Patient ID: Alexandra Bowen, female    DOB: 1936/03/06, 75 y.o.   MRN: [id number]  HPI  she is here for a checkup. She was seen in late May and evaluated for chest pain. Blood work x-rays and stress tests were all negative. She was placed on potassium supplements for a potassium of 3.5. At this time she is having no difficulty. She was given Protonix however she has not taken it because she's not had anymore chest pain. She has no other concerns or complaints. She is widowed and enjoys an active social life. She does not smoke or drink.   Review of Systems Negative except as above    Objective:   Physical Exam BP 126/84  Pulse 70  Ht 5\' 6"  (1.676 m)  Wt 197 lb (89.359 kg)  BMI 31.80 kg/m2  General Appearance:    Alert, cooperative, no distress, appears stated age  Head:    Normocephalic, without obvious abnormality, atraumatic  Eyes:    PERRL, conjunctiva/corneas clear, EOM's intact, fundi    benign  Ears:    Normal TM's and external ear canals  Nose:   Nares normal, mucosa normal, no drainage or sinus   tenderness  Throat:   Lips, mucosa, and tongue normal; teeth and gums normal  Neck:   Supple, no lymphadenopathy;  thyroid:  no   enlargement/tenderness/nodules; no carotid   bruit or JVD  Back:    Spine nontender, no curvature, ROM normal, no CVA     tenderness  Lungs:     Clear to auscultation bilaterally without wheezes, rales or     ronchi; respirations unlabored  Chest Wall:    No tenderness or deformity   Heart:    Regular rate and rhythm, S1 and S2 normal, no murmur, rub   or gallop  Breast Exam:    Deferred to GYN  Abdomen:     Soft, non-tender, nondistended, normoactive bowel sounds,    no masses, no hepatosplenomegaly  Genitalia:    Deferred to GYN     Extremities:   No clubbing, cyanosis or edema  Pulses:   2+ and symmetric all extremities  Skin:   Skin color, texture, turgor normal, no rashes or lesions  Lymph nodes:   Cervical, supraclavicular, and  axillary nodes normal  Neurologic:   CNII-XII intact, normal strength, sensation and gait; reflexes 2+ and symmetric throughout          Psych:   Normal mood, affect, hygiene and grooming.            Assessment & Plan:  Hypertension. Family history of heart disease. Obesity. Arthritis. Hyperlipidemia. Hypokalemia  Continue on present medications. I will also check electrolytes and possibly have her stop potassium supplements.

## 2010-11-22 NOTE — Patient Instructions (Signed)
Stay on your present medications. Call me if you have any more trouble with abdominal pain.

## 2010-11-25 ENCOUNTER — Telehealth: Payer: Self-pay

## 2010-11-25 NOTE — Telephone Encounter (Signed)
Called pt to inform her to stop potasium

## 2011-02-09 ENCOUNTER — Other Ambulatory Visit: Payer: Self-pay | Admitting: Family Medicine

## 2011-02-11 ENCOUNTER — Other Ambulatory Visit (INDEPENDENT_AMBULATORY_CARE_PROVIDER_SITE_OTHER): Payer: Medicare Other

## 2011-02-11 ENCOUNTER — Other Ambulatory Visit: Payer: Self-pay

## 2011-02-11 DIAGNOSIS — Z23 Encounter for immunization: Secondary | ICD-10-CM

## 2011-02-11 MED ORDER — SIMVASTATIN 20 MG PO TABS: 20.0000 mg | ORAL_TABLET | Freq: Every day | ORAL | Status: DC

## 2011-07-17 ENCOUNTER — Encounter: Payer: Self-pay | Admitting: Family Medicine

## 2011-07-17 ENCOUNTER — Ambulatory Visit (INDEPENDENT_AMBULATORY_CARE_PROVIDER_SITE_OTHER): Payer: Medicare Other | Admitting: Family Medicine

## 2011-07-17 VITALS — BP 126/80 | HR 79 | Temp 98.4°F | Wt 203.0 lb

## 2011-07-17 DIAGNOSIS — L03115 Cellulitis of right lower limb: Secondary | ICD-10-CM

## 2011-07-17 DIAGNOSIS — L03119 Cellulitis of unspecified part of limb: Secondary | ICD-10-CM

## 2011-07-17 MED ORDER — DOXYCYCLINE HYCLATE 100 MG PO TABS: 100.0000 mg | ORAL_TABLET | Freq: Two times a day (BID) | ORAL | Status: DC

## 2011-07-17 NOTE — Progress Notes (Signed)
  Subjective:    Patient ID: Alexandra Bowen, female    DOB: 04-Apr-1936, 76 y.o.   MRN: [id number]  HPI She abraded her right shin area approximately 10 days ago and roughly 3 days after that noted some redness in that area. The redness has slowly progressed.   Review of Systems     Objective:   Physical Exam She has 3 areas of abrasion over the right anterior shin all approximately 4 cm in diameter. The surrounding area is erythematous and warm. There is some distal ecchymosis especially in the medial ankle area.       Assessment & Plan:   1. Cellulitis of right lower extremity  doxycycline (VIBRA-TABS) 100 MG tablet   she is to keep the area clean and dry and elevated. The area of erythema was marked with a pen. She will keep him on this and let me know if it expands.

## 2011-07-17 NOTE — Patient Instructions (Signed)
Clean it daily with soap and water and use the Neosporin. Keep leg elevated as much as possible.

## 2011-07-21 ENCOUNTER — Ambulatory Visit (INDEPENDENT_AMBULATORY_CARE_PROVIDER_SITE_OTHER): Payer: Medicare Other | Admitting: Family Medicine

## 2011-07-21 ENCOUNTER — Encounter: Payer: Self-pay | Admitting: Family Medicine

## 2011-07-21 VITALS — BP 140/90 | HR 78 | Wt 203.0 lb

## 2011-07-21 DIAGNOSIS — L0390 Cellulitis, unspecified: Secondary | ICD-10-CM

## 2011-07-21 DIAGNOSIS — L0291 Cutaneous abscess, unspecified: Secondary | ICD-10-CM

## 2011-07-21 NOTE — Progress Notes (Signed)
  Subjective:    Patient ID: Alexandra Bowen, female    DOB: 02/18/1936, 76 y.o.   MRN: [id number]  HPI She is here for a recheck. She notes a slight improvement in the color of her lower M.D. as well as decreased discoloration in her ankle area.   Review of Systems     Objective:   Physical Exam Exam of the skin that showed decreased erythema as well as swelling.       Assessment & Plan:  . 1. Cellulitis    it does seem to be slowly healing. She is to continue conservative care and the antibiotic and return here in one week for recheck.

## 2011-07-28 ENCOUNTER — Ambulatory Visit (INDEPENDENT_AMBULATORY_CARE_PROVIDER_SITE_OTHER): Payer: Medicare Other | Admitting: Family Medicine

## 2011-07-28 ENCOUNTER — Encounter: Payer: Self-pay | Admitting: Family Medicine

## 2011-07-28 VITALS — BP 126/80 | HR 61 | Wt 203.0 lb

## 2011-07-28 DIAGNOSIS — L02419 Cutaneous abscess of limb, unspecified: Secondary | ICD-10-CM

## 2011-07-28 DIAGNOSIS — L03115 Cellulitis of right lower limb: Secondary | ICD-10-CM

## 2011-07-28 MED ORDER — DOXYCYCLINE HYCLATE 100 MG PO TABS: 100.0000 mg | ORAL_TABLET | Freq: Two times a day (BID) | ORAL | Status: AC

## 2011-07-28 NOTE — Progress Notes (Signed)
  Subjective:    Patient ID: Alexandra Bowen, female    DOB: 05/31/1935, 76 y.o.   MRN: [id number]  HPI She is here for recheck. Her leg continues to improve slowly. She is having no pain and the drainage has stopped.  Review of Systems     Objective:   Physical Exam Exam the right lower leg does show less erythema. It is not warm or tender.       Assessment & Plan:   1. Cellulitis of right lower extremity  doxycycline (VIBRA-TABS) 100 MG tablet   she will continue to keep it covered to keep from getting irritated. She will call me if she has any further trouble.

## 2011-07-28 NOTE — Patient Instructions (Signed)
Return here as needed. °

## 2011-07-30 ENCOUNTER — Other Ambulatory Visit: Payer: Self-pay | Admitting: Family Medicine

## 2011-07-30 DIAGNOSIS — Z1231 Encounter for screening mammogram for malignant neoplasm of breast: Secondary | ICD-10-CM

## 2011-08-18 ENCOUNTER — Ambulatory Visit
Admission: RE | Admit: 2011-08-18 | Discharge: 2011-08-18 | Disposition: A | Discharge status: Home / Self Care | Payer: Medicare Other | Source: Ambulatory Visit | Attending: Family Medicine | Admitting: Family Medicine

## 2011-08-18 DIAGNOSIS — Z1231 Encounter for screening mammogram for malignant neoplasm of breast: Secondary | ICD-10-CM

## 2011-11-11 ENCOUNTER — Encounter: Payer: Self-pay | Admitting: Internal Medicine

## 2011-11-24 ENCOUNTER — Ambulatory Visit (INDEPENDENT_AMBULATORY_CARE_PROVIDER_SITE_OTHER): Payer: Medicare Other | Admitting: Family Medicine

## 2011-11-24 ENCOUNTER — Other Ambulatory Visit: Payer: Self-pay | Admitting: Family Medicine

## 2011-11-24 ENCOUNTER — Encounter: Payer: Self-pay | Admitting: Family Medicine

## 2011-11-24 VITALS — BP 142/90 | HR 78 | Ht 66.0 in | Wt 196.0 lb

## 2011-11-24 DIAGNOSIS — M1990 Unspecified osteoarthritis, unspecified site: Secondary | ICD-10-CM

## 2011-11-24 DIAGNOSIS — E785 Hyperlipidemia, unspecified: Secondary | ICD-10-CM

## 2011-11-24 DIAGNOSIS — Z79899 Other long term (current) drug therapy: Secondary | ICD-10-CM

## 2011-11-24 DIAGNOSIS — M129 Arthropathy, unspecified: Secondary | ICD-10-CM

## 2011-11-24 DIAGNOSIS — M8580 Other specified disorders of bone density and structure, unspecified site: Secondary | ICD-10-CM

## 2011-11-24 DIAGNOSIS — Z8249 Family history of ischemic heart disease and other diseases of the circulatory system: Secondary | ICD-10-CM

## 2011-11-24 DIAGNOSIS — E669 Obesity, unspecified: Secondary | ICD-10-CM

## 2011-11-24 DIAGNOSIS — Z0000 Encounter for general adult medical examination without abnormal findings: Secondary | ICD-10-CM

## 2011-11-24 DIAGNOSIS — I10 Essential (primary) hypertension: Secondary | ICD-10-CM

## 2011-11-24 LAB — LIPID PANEL
HDL: 49 mg/dL (ref >39)
Triglycerides: 89 mg/dL (ref <150)

## 2011-11-24 LAB — CBC WITH DIFFERENTIAL/PLATELET
HCT: 42.1 % (ref 36.0–46.0)
Hemoglobin: 13.6 g/dL (ref 12.0–15.0)
Lymphocytes Relative: 22 % (ref 12–46)
Lymphs Abs: 1.9 10*3/uL (ref 0.7–4.0)
Monocytes Absolute: 0.5 10*3/uL (ref 0.1–1.0)
Monocytes Relative: 6 % (ref 3–12)
Neutro Abs: 5.6 10*3/uL (ref 1.7–7.7)
WBC: 8.3 10*3/uL (ref 4.0–10.5)

## 2011-11-24 LAB — COMPREHENSIVE METABOLIC PANEL
Albumin: 4.3 g/dL (ref 3.5–5.2)
BUN: 17 mg/dL (ref 6–23)
Calcium: 9.4 mg/dL (ref 8.4–10.5)
Chloride: 107 mEq/L (ref 96–112)
Glucose, Bld: 123 mg/dL — ABNORMAL HIGH (ref 70–99)
Potassium: 3.7 mEq/L (ref 3.5–5.3)

## 2011-11-24 MED ORDER — SIMVASTATIN 20 MG PO TABS: 20.0000 mg | ORAL_TABLET | Freq: Every day | ORAL | Status: DC

## 2011-11-24 MED ORDER — AMLODIPINE BESYLATE 5 MG PO TABS: 5.0000 mg | ORAL_TABLET | Freq: Every day | ORAL | Status: DC

## 2011-11-24 MED ORDER — BISOPROLOL-HYDROCHLOROTHIAZIDE 10-6.25 MG PO TABS: 1.0000 | ORAL_TABLET | Freq: Every day | ORAL | Status: DC

## 2011-11-24 NOTE — Patient Instructions (Signed)
Keep taking good care of yourself 

## 2011-11-24 NOTE — Progress Notes (Signed)
  Subjective:    Patient ID: Alexandra Bowen, female    DOB: [birth year], 76 y.o.   MRN: [id number]  HPI She is here for an interval evaluation. She continues on medications listed in the chart. She is having no difficulty with them. She does keep yourself active in her retirement and walks regularly. She has had a colonoscopy, mammogram and bone density scanning done. Her immunizations are up to date. Social and family history was reviewed and is unchanged. She has no particular concerns or complaints.   Review of Systems  Constitutional: Negative.   HENT: Negative.   Respiratory: Negative.   Cardiovascular: Negative.   Gastrointestinal: Negative.   Genitourinary: Negative.   Musculoskeletal: Negative.   Neurological: Negative.   Hematological: Negative.   Psychiatric/Behavioral: Negative.        Objective:   Physical Exam BP 142/90  Pulse 78  Ht 5\' 6"  (1.676 m)  Wt 196 lb (88.905 kg)  BMI 31.64 kg/m2  SpO2 98%  General Appearance:    Alert, cooperative, no distress, appears stated age  Head:    Normocephalic, without obvious abnormality, atraumatic  Eyes:    PERRL, conjunctiva/corneas clear, EOM's intact, fundi    benign  Ears:    Normal TM's and external ear canals  Nose:   Nares normal, mucosa normal, no drainage or sinus   tenderness  Throat:   Lips, mucosa, and tongue normal; teeth and gums normal  Neck:   Supple, no lymphadenopathy;  thyroid:  no   enlargement/tenderness/nodules; no carotid   bruit or JVD  Back:    Spine nontender, no curvature, ROM normal, no CVA     tenderness  Lungs:     Clear to auscultation bilaterally without wheezes, rales or     ronchi; respirations unlabored  Chest Wall:    No tenderness or deformity   Heart:    Regular rate and rhythm, S1 and S2 normal, no murmur, rub   or gallop  Breast Exam:    Deferred to GYN  Abdomen:     Soft, non-tender, nondistended, normoactive bowel sounds,    no masses, no hepatosplenomegaly  Genitalia:    Deferred  to GYN     Extremities:   No clubbing, cyanosis or edema  Pulses:   2+ and symmetric all extremities  Skin:   Skin color, texture, turgor normal, no rashes or lesions  Lymph nodes:   Cervical, supraclavicular, and axillary nodes normal  Neurologic:   CNII-XII intact, normal strength, sensation and gait; reflexes 2+ and symmetric throughout          Psych:   Normal mood, affect, hygiene and grooming.           Assessment & Plan:   1. Routine general medical examination at a health care facility  CBC with Differential, Comprehensive metabolic panel, Lipid panel  2. Arthritis    3. Family history of heart disease in female family member before age 58    4. Hyperlipidemia LDL goal <100  simvastatin (ZOCOR) 20 MG tablet  5. Hypertension  bisoprolol-hydrochlorothiazide (ZIAC) 10-6.25 MG per tablet, amLODipine (NORVASC) 5 MG tablet  6. Obesity (BMI 30-39.9)    7. Osteopenia    8. Encounter for long-term (current) use of other medications  CBC with Differential, Comprehensive metabolic panel, Lipid panel

## 2011-11-25 LAB — HEMOGLOBIN A1C: Hgb A1c MFr Bld: 6.1 % — ABNORMAL HIGH (ref <5.7)

## 2011-11-28 ENCOUNTER — Ambulatory Visit (INDEPENDENT_AMBULATORY_CARE_PROVIDER_SITE_OTHER): Payer: Medicare Other | Admitting: Family Medicine

## 2011-11-28 DIAGNOSIS — R7309 Other abnormal glucose: Secondary | ICD-10-CM

## 2011-11-28 DIAGNOSIS — E669 Obesity, unspecified: Secondary | ICD-10-CM

## 2011-11-28 DIAGNOSIS — R7302 Impaired glucose tolerance (oral): Secondary | ICD-10-CM

## 2011-11-28 NOTE — Progress Notes (Signed)
  Subjective:    Patient ID: Alexandra Bowen, female    DOB: December 31, 1935, 76 y.o.   MRN: [id number]  HPI She is here for consultation. Recent hemoglobin A1c was 6.1. She does have several family members with diabetes and has been checking her blood sugars for the last 6 months. She states that she has lost 6 pounds. She is also exercising walking for 15 minutes twice per day plus yard work.   Review of Systems     Objective:   Physical Exam Alert and in no distress.       Assessment & Plan:   1. Glucose intolerance (impaired glucose tolerance)   2. Obesity (BMI 30-39.9)    discussed diet and exercise with her. Encouraged her to continue with her present exercise regimen. I will send her for nutrition counseling. Reevaluate all this in 6 months.

## 2011-12-12 ENCOUNTER — Other Ambulatory Visit: Payer: Self-pay | Admitting: Family Medicine

## 2011-12-22 ENCOUNTER — Ambulatory Visit: Payer: Medicare Other | Admitting: *Deleted

## 2012-02-10 ENCOUNTER — Other Ambulatory Visit: Payer: Medicare Other

## 2012-02-10 DIAGNOSIS — Z23 Encounter for immunization: Secondary | ICD-10-CM

## 2012-02-10 MED ORDER — INFLUENZA VIRUS VACC SPLIT PF IM SUSP: 0.5000 mL | Freq: Once | INTRAMUSCULAR | Status: AC

## 2012-03-12 ENCOUNTER — Other Ambulatory Visit: Payer: Self-pay | Admitting: Family Medicine

## 2012-07-13 ENCOUNTER — Other Ambulatory Visit: Payer: Self-pay

## 2012-07-13 DIAGNOSIS — Z1231 Encounter for screening mammogram for malignant neoplasm of breast: Secondary | ICD-10-CM

## 2012-08-18 ENCOUNTER — Ambulatory Visit
Admission: RE | Admit: 2012-08-18 | Discharge: 2012-08-18 | Disposition: A | Discharge status: Home / Self Care | Payer: Medicare Other | Source: Ambulatory Visit

## 2012-08-18 DIAGNOSIS — Z1231 Encounter for screening mammogram for malignant neoplasm of breast: Secondary | ICD-10-CM

## 2012-08-23 ENCOUNTER — Other Ambulatory Visit: Payer: Self-pay | Admitting: Family Medicine

## 2012-08-23 DIAGNOSIS — R928 Other abnormal and inconclusive findings on diagnostic imaging of breast: Secondary | ICD-10-CM

## 2012-08-26 DIAGNOSIS — D0512 Intraductal carcinoma in situ of left breast: Secondary | ICD-10-CM

## 2012-08-26 HISTORY — DX: Intraductal carcinoma in situ of left breast: D05.12

## 2012-09-03 ENCOUNTER — Other Ambulatory Visit: Payer: Self-pay | Admitting: Family Medicine

## 2012-09-03 ENCOUNTER — Ambulatory Visit
Admission: RE | Admit: 2012-09-03 | Discharge: 2012-09-03 | Disposition: A | Discharge status: Home / Self Care | Payer: Medicare Other | Source: Ambulatory Visit | Attending: Family Medicine | Admitting: Family Medicine

## 2012-09-03 DIAGNOSIS — R928 Other abnormal and inconclusive findings on diagnostic imaging of breast: Secondary | ICD-10-CM

## 2012-09-03 DIAGNOSIS — R921 Mammographic calcification found on diagnostic imaging of breast: Secondary | ICD-10-CM

## 2012-09-06 ENCOUNTER — Other Ambulatory Visit: Payer: Self-pay | Admitting: Family Medicine

## 2012-09-06 ENCOUNTER — Ambulatory Visit
Admission: RE | Admit: 2012-09-06 | Discharge: 2012-09-06 | Disposition: A | Discharge status: Home / Self Care | Payer: Medicare Other | Source: Ambulatory Visit | Attending: Family Medicine | Admitting: Family Medicine

## 2012-09-06 ENCOUNTER — Telehealth: Payer: Self-pay

## 2012-09-06 DIAGNOSIS — R921 Mammographic calcification found on diagnostic imaging of breast: Secondary | ICD-10-CM

## 2012-09-06 DIAGNOSIS — C50912 Malignant neoplasm of unspecified site of left female breast: Secondary | ICD-10-CM

## 2012-09-06 NOTE — Telephone Encounter (Signed)
[facility] THIS MORN[facility] AND ASKED ME TO GET PRECERT FOR MRI BI -LAT THEY HAD DONE A BIOPSY OF LEFT BREAST ON 09/06/12 COMING BACK IN POS. 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 09/10/12 AT 8:30 AM THEY WILL BE INFORMING PT OF CANCER THIS WEEK SHE HAS A FOLLOW UP APPT. WITH THEM THE INS[employer] COMPANY GAVE PRECERT. # [phone] I HAVE FAXED PAPERS BACK TO [facility]

## 2012-09-07 ENCOUNTER — Telehealth: Payer: Self-pay | Admitting: *Deleted

## 2012-09-07 ENCOUNTER — Other Ambulatory Visit: Payer: Self-pay | Admitting: *Deleted

## 2012-09-07 DIAGNOSIS — C50212 Malignant neoplasm of upper-inner quadrant of left female breast: Secondary | ICD-10-CM | POA: Insufficient documentation

## 2012-09-07 DIAGNOSIS — C50412 Malignant neoplasm of upper-outer quadrant of left female breast: Secondary | ICD-10-CM

## 2012-09-07 DIAGNOSIS — C50419 Malignant neoplasm of upper-outer quadrant of unspecified female breast: Secondary | ICD-10-CM | POA: Insufficient documentation

## 2012-09-07 NOTE — Telephone Encounter (Signed)
Confirmed [facility] for 09/15/12 at 1200 .  Instructions and contact information given. 

## 2012-09-09 ENCOUNTER — Encounter: Payer: Self-pay | Admitting: *Deleted

## 2012-09-10 ENCOUNTER — Ambulatory Visit
Admission: RE | Admit: 2012-09-10 | Discharge: 2012-09-10 | Disposition: A | Discharge status: Home / Self Care | Payer: Medicare Other | Source: Ambulatory Visit | Attending: Family Medicine | Admitting: Family Medicine

## 2012-09-10 DIAGNOSIS — C50912 Malignant neoplasm of unspecified site of left female breast: Secondary | ICD-10-CM

## 2012-09-10 MED ORDER — GADOBENATE DIMEGLUMINE 529 MG/ML IV SOLN
18.0000 mL | Freq: Once | INTRAVENOUS | Status: AC | PRN
  Administered 2012-09-10: 18 mL via INTRAVENOUS

## 2012-09-13 ENCOUNTER — Encounter: Payer: Self-pay | Admitting: Internal Medicine

## 2012-09-15 ENCOUNTER — Encounter: Payer: Self-pay | Admitting: Radiation Oncology

## 2012-09-15 ENCOUNTER — Other Ambulatory Visit: Payer: Self-pay | Admitting: Family Medicine

## 2012-09-15 ENCOUNTER — Ambulatory Visit (HOSPITAL_BASED_OUTPATIENT_CLINIC_OR_DEPARTMENT_OTHER): Payer: Medicare Other

## 2012-09-15 ENCOUNTER — Ambulatory Visit
Admission: RE | Admit: 2012-09-15 | Discharge: 2012-09-15 | Disposition: A | Discharge status: Home / Self Care | Payer: Medicare Other | Source: Ambulatory Visit | Attending: Radiation Oncology | Admitting: Radiation Oncology

## 2012-09-15 ENCOUNTER — Ambulatory Visit: Payer: Medicare Other | Attending: General Surgery | Admitting: Physical Therapy

## 2012-09-15 ENCOUNTER — Ambulatory Visit (HOSPITAL_BASED_OUTPATIENT_CLINIC_OR_DEPARTMENT_OTHER): Payer: Medicare Other | Admitting: Oncology

## 2012-09-15 ENCOUNTER — Ambulatory Visit (HOSPITAL_BASED_OUTPATIENT_CLINIC_OR_DEPARTMENT_OTHER): Payer: Medicare Other | Admitting: General Surgery

## 2012-09-15 ENCOUNTER — Other Ambulatory Visit (HOSPITAL_BASED_OUTPATIENT_CLINIC_OR_DEPARTMENT_OTHER): Payer: Medicare Other | Admitting: Lab

## 2012-09-15 ENCOUNTER — Encounter: Payer: Self-pay | Admitting: *Deleted

## 2012-09-15 ENCOUNTER — Encounter: Payer: Self-pay | Admitting: Oncology

## 2012-09-15 VITALS — BP 158/85 | HR 66 | Temp 97.6°F | Resp 20 | Ht 66.0 in | Wt 194.2 lb

## 2012-09-15 DIAGNOSIS — R928 Other abnormal and inconclusive findings on diagnostic imaging of breast: Secondary | ICD-10-CM

## 2012-09-15 DIAGNOSIS — C50412 Malignant neoplasm of upper-outer quadrant of left female breast: Secondary | ICD-10-CM

## 2012-09-15 DIAGNOSIS — C50919 Malignant neoplasm of unspecified site of unspecified female breast: Secondary | ICD-10-CM | POA: Insufficient documentation

## 2012-09-15 DIAGNOSIS — D0590 Unspecified type of carcinoma in situ of unspecified breast: Secondary | ICD-10-CM

## 2012-09-15 DIAGNOSIS — C50419 Malignant neoplasm of upper-outer quadrant of unspecified female breast: Secondary | ICD-10-CM

## 2012-09-15 DIAGNOSIS — Z170 Estrogen receptor positive status [ER+]: Secondary | ICD-10-CM

## 2012-09-15 DIAGNOSIS — IMO0001 Reserved for inherently not codable concepts without codable children: Secondary | ICD-10-CM | POA: Insufficient documentation

## 2012-09-15 DIAGNOSIS — R293 Abnormal posture: Secondary | ICD-10-CM | POA: Insufficient documentation

## 2012-09-15 LAB — CBC WITH DIFFERENTIAL/PLATELET
BASO%: 0.3 % (ref 0.0–2.0)
EOS%: 2.8 % (ref 0.0–7.0)
LYMPH%: 24.8 % (ref 14.0–49.7)
MCH: 26.7 pg (ref 25.1–34.0)
MCHC: 32.7 g/dL (ref 31.5–36.0)
MCV: 81.7 fL (ref 79.5–101.0)
MONO#: 0.6 10*3/uL (ref 0.1–0.9)
MONO%: 6.1 % (ref 0.0–14.0)
Platelets: 202 10*3/uL (ref 145–400)
RBC: 5.16 10*6/uL (ref 3.70–5.45)
WBC: 9.2 10*3/uL (ref 3.9–10.3)

## 2012-09-15 LAB — COMPREHENSIVE METABOLIC PANEL (CC13)
ALT: 16 U/L (ref 0–55)
AST: 21 U/L (ref 5–34)
Alkaline Phosphatase: 102 U/L (ref 40–150)
Creatinine: 0.8 mg/dL (ref 0.6–1.1)
Sodium: 144 mEq/L (ref 136–145)
Total Bilirubin: 0.6 mg/dL (ref 0.20–1.20)
Total Protein: 7.2 g/dL (ref 6.4–8.3)

## 2012-09-15 NOTE — Progress Notes (Signed)
Chief complaint:  Left breast DCIS  HISTORY: Patient is a 76-year-old female who presented with screen detected left breast calcifications. On mammogram and ultrasound these were 1.5 x 0.5 x 0.5 cm. This is linear in arrangement.  Biopsy demonstrated high-grade DCIS with necrosis. Hormone receptor status was positive estrogen and  positive progesterone.  She subsequently underwent MRI which demonstrated a much wider area of non-mass enhancement.  This whole area was 4.1 x 2.8 cm.  She denied breast pain prior to the breast biopsy. She did have significant bruising with the biopsy.  She does not on any family history of cancer. She has had some nonmelanoma skin cancer on the face. He is retired. She is accompanied by her sister-in-law. She had menarche at age 14. Last period was around 28 years ago. She did not never used hormone replacement. She has had a colonoscopy and a bone density study.  Past Medical History  Diagnosis Date  . Hypertension     WHITE COAT SYNDROME  . Allergy   . Obesity   . Arthritis   . Dyslipidemia   . Glucose intolerance (impaired glucose tolerance)   . Renal stone   . 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 Breast cancer     No past surgical history on file.  Current Outpatient Prescriptions  Medication Sig Dispense Refill  . amLODipine (NORVASC) 5 MG tablet Take 1 tablet (5 mg total) by mouth daily.  30 tablet  11  . amLODipine (NORVASC) 5 MG tablet TAKE 1 TABLET BY MOUTH DAILY  30 tablet  10  . aspirin 81 MG tablet Take 81 mg by mouth daily.        . bisoprolol-hydrochlorothiazide (ZIAC) 10-6.25 MG per tablet Take 1 tablet by mouth daily.  30 tablet  11  . bisoprolol-hydrochlorothiazide (ZIAC) 10-6.25 MG per tablet TAKE 1 TABLET BY MOUTH DAILY  30 tablet  10  . calcium citrate-vitamin D 200-200 MG-UNIT TABS Take 1 tablet by mouth 3 (three) times daily.        . Ibuprofen (ADVIL PO) Take by mouth as needed.      . Multiple  Vitamins-Minerals (MULTIVITAMIN WITH MINERALS) tablet Take 1 tablet by mouth daily.        . simvastatin (ZOCOR) 20 MG tablet Take 1 tablet (20 mg total) by mouth at bedtime.  30 tablet  11  . simvastatin (ZOCOR) 20 MG tablet TAKE 1 TABLET BY MOUTH AT BEDTIME  30 tablet  0  . [DISCONTINUED] potassium chloride (KLOR-CON) 10 MEQ CR tablet Take 10 mEq by mouth daily.         No current facility-administered medications for this visit.   Facility-Administered Medications Ordered in Other Visits  Medication Dose Route Frequency Provider Last Rate Last Dose  . influenza  inactive virus vaccine (FLUZONE/FLUARIX) injection 0.5 mL  0.5 mL Intramuscular Once John C Lalonde, MD         No Known Allergies   No family history on file.   History   Social History  . Marital Status: Widowed    Spouse Name: N/A    Number of Children: N/A  . Years of Education: N/A   Social History Main Topics  . Smoking status: Never Smoker   . Smokeless tobacco: Never Used  . Alcohol Use: No  . Drug Use: No  . Sexually Active: Not Currently   Other Topics Concern  . Not on file   Social   History Narrative  . No narrative on file     REVIEW OF SYSTEMS - PERTINENT POSITIVES ONLY: 12 point review of systems negative other than HPI and PMH except for low back pain, top dentures, heartburn, arthritis  EXAM: Wt Readings from Last 3 Encounters:  09/15/12 194 lb 3.2 oz (88.089 kg)  11/24/11 196 lb (88.905 kg)  07/28/11 203 lb (92.08 kg)   Temp Readings from Last 3 Encounters:  09/15/12 97.6 F (36.4 C) Oral  07/17/11 98.4 F (36.9 C) Oral   BP Readings from Last 3 Encounters:  09/15/12 158/85  11/24/11 142/90  07/28/11 126/80   Pulse Readings from Last 3 Encounters:  09/15/12 66  11/24/11 78  07/28/11 61    Gen:  No acute distress.  Well nourished and well groomed.   Neurological: Alert and oriented to person, place, and time. Coordination normal.  Head: Normocephalic and atraumatic.   Eyes: Conjunctivae are normal. Pupils are equal, round, and reactive to light. No scleral icterus.  Neck: Normal range of motion. Neck supple. No tracheal deviation or thyromegaly present.  Cardiovascular: Normal rate, regular rhythm, normal heart sounds and intact distal pulses.  Exam reveals no gallop and no friction rub.  No murmur heard. Respiratory: Effort normal.  No respiratory distress. No chest wall tenderness. Breath sounds normal.  No wheezes, rales or rhonchi.  Breast:  Left breast with bruising in the upper outer quadrant.  No mass, no skin dimpling or nipple discharge/retraction.  No axillary lymphadenopathy.  No left sided findings.  Breasts are symmetric and ptotic bilaterally.   GI: Soft. Bowel sounds are normal. The abdomen is soft and nontender.  There is no rebound and no guarding.  Musculoskeletal: Normal range of motion. Extremities are nontender.  Lymphadenopathy: No cervical, preauricular, postauricular or axillary adenopathy is present Skin: Skin is warm and dry. No rash noted. No diaphoresis. No erythema. No pallor. No clubbing, cyanosis, or edema.  There are some shiny areas on her face in sites of biopsies.   Psychiatric: Normal mood and affect. Behavior is normal. Judgment and thought content normal.    LABORATORY RESULTS: Available labs are reviewed  CBC normal, cmet normal other than elevated glucose.    RADIOLOGY RESULTS: See E-Chart or I-Site for most recent results.  Images and reports are reviewed.  Mammogram IMPRESSION:  Calcifications, left breast. Additional evaluation is indicated.   MR Findings: Mild parenchymal enhancement seen, left greater than  right and there are foci of nonspecific enhancement bilaterally.  Postbiopsy changes seen in the retroareolar region of the left  breast, middle third with a biopsy clip and hematoma measuring 1.8  x 2.0 cm. Clumped and linear enhancement extends anteriorly from  the biopsy cavity toward the nipple  measuring 4.1 (AP) x 2.6 x 2.8  cm. No other suspicious mass or enhancement seen in either breast.  No nipple or skin enhancement is noted. There is no axillary or  internal mammary adenopathy. Incidental note is made of a 3.8 cm  pericardial cyst.  IMPRESSION: Known malignancy, left breast. No MRI specific  evidence of malignancy, right breast.     ASSESSMENT AND PLAN: Cancer of upper-outer quadrant of female breast, L DCIS Patient very much desires to pursue breast conservation. If this is the case, I would like to pursue MR guided biopsy.  The region of non-mass enhancement on the MR was around 4.1 cm.  This would be a very large lumpectomy. However, she does have generous sized breasts. She would prefer   to pursue first reduction on the contralateral side prior to pursuing mastectomy on the affected side.  We have scheduled the MRI biopsy. We have also scheduled referral to plastic surgery.  Once I received word about these 2 things, we'll schedule her for either for bracketed lumpectomy or a simple lumpectomy.   I reviewed the risk of surgery including bleeding, infection, damage to adjacent structures, chronic pain, seroma, need for additional surgeries, and other unforseen circumstances.   If we determine that we would have to do a mastectomy, I will see her back in clinic to discuss.  She will likely also proceed with anti hormone treatment and +/- radiation, depending on final pathology.    Examination, counseling, and coordination of care was 45 min, with >50% spent in counseling.       Evertt Chouinard L Harrietta Incorvaia MD Surgical Oncology, General and Endocrine Surgery [facility]      Visit Diagnoses: 1. Cancer of upper-outer quadrant of female breast, left     Primary Care Physician: LALONDE,JOHN CHARLES, MD    

## 2012-09-15 NOTE — Progress Notes (Signed)
Radiation Oncology         [phone] ________________________________  Initial outpatient Consultation  Name: Alexandra Bowen MRN: [id number]  Date: 09/15/2012  DOB: 08/21/1935  WU:[id number],NWGN Leonette Most, MD  Almond Lint, MD   REFERRING PHYSICIAN: Almond Lint, MD  DIAGNOSIS: DCIS, left breast, High grade with necrosis, ER/PR +  HISTORY OF PRESENT ILLNESS::Alexandra Bowen is a 77 y.o. female who was found on screening mammography to have a 15x5x43mm area of linearly arranged calcifications in the left breast.  Biopsy under stereotactic guidance revealed high grade  DCIS w/ necrosis which is ER/PR+.  MRI of her breasts revealed non mass like enhancement in the left breast measuring 4.2 x 2.6 x 2.8 cm.   She is otherwise in her usual state of health.  PREVIOUS RADIATION THERAPY: No  PAST MEDICAL HISTORY:  has a past medical history of Hypertension; Allergy; Obesity; Arthritis; Dyslipidemia; Glucose intolerance (impaired glucose tolerance); Renal stone; Ductal carcinoma in situ of left breast (08/2012); and Breast cancer.    PAST SURGICAL HISTORY:No past surgical history on file.  FAMILY HISTORY: family history is not on file.  SOCIAL HISTORY:  reports that she has never smoked. She has never used smokeless tobacco. She reports that she does not drink alcohol or use illicit drugs.  ALLERGIES: Review of patient's allergies indicates no known allergies.  MEDICATIONS:  Current Outpatient Prescriptions  Medication Sig Dispense Refill  . amLODipine (NORVASC) 5 MG tablet Take 1 tablet (5 mg total) by mouth daily.  30 tablet  11  . amLODipine (NORVASC) 5 MG tablet TAKE 1 TABLET BY MOUTH DAILY  30 tablet  10  . aspirin 81 MG tablet Take 81 mg by mouth daily.        . bisoprolol-hydrochlorothiazide (ZIAC) 10-6.25 MG per tablet Take 1 tablet by mouth daily.  30 tablet  11  . bisoprolol-hydrochlorothiazide (ZIAC) 10-6.25 MG per tablet TAKE 1 TABLET BY MOUTH DAILY  30 tablet  10  . calcium  citrate-vitamin D 200-200 MG-UNIT TABS Take 1 tablet by mouth 3 (three) times daily.        . Ibuprofen (ADVIL PO) Take by mouth as needed.      . Multiple Vitamins-Minerals (MULTIVITAMIN WITH MINERALS) tablet Take 1 tablet by mouth daily.        . simvastatin (ZOCOR) 20 MG tablet Take 1 tablet (20 mg total) by mouth at bedtime.  30 tablet  11  . simvastatin (ZOCOR) 20 MG tablet TAKE 1 TABLET BY MOUTH AT BEDTIME  30 tablet  0  . [DISCONTINUED] potassium chloride (KLOR-CON) 10 MEQ CR tablet Take 10 mEq by mouth daily.         No current facility-administered medications for this encounter.   Facility-Administered Medications Ordered in Other Encounters  Medication Dose Route Frequency Provider Last Rate Last Dose  . influenza  inactive virus vaccine (FLUZONE/FLUARIX) injection 0.5 mL  0.5 mL Intramuscular Once Ronnald Nian, MD        REVIEW OF SYSTEMS:  Pertinent findings listed above   PHYSICAL EXAM:  Vitals - 1 value per visit 09/15/2012  SYSTOLIC 158  DIASTOLIC 85  PULSE 66  TEMPERATURE 97.6  RESPIRATIONS 20  Weight (lb) 194.2  HEIGHT 5\' 6"   BMI 31.36  VISIT REPORT    General: Alert and oriented, in no acute distress HEENT: Head is normocephalic. Pupils are equally round and reactive to light. Extraocular movements are intact. Oropharynx is clear. Neck: Neck is supple, no palpable cervical or supraclavicular  lymphadenopathy. Heart: Regular in rate and rhythm with faint murmur in aortic region Chest: Clear to auscultation bilaterally, with no rhonchi, wheezes, or rales. Abdomen: Soft, nontender, nondistended, with no rigidity or guarding. Extremities: No cyanosis or edema. Lymphatics: No concerning lymphadenopathy. Skin: No concerning lesions. Musculoskeletal: symmetric strength and muscle tone throughout. Neurologic: Cranial nerves II through XII are grossly intact. No obvious focalities. Speech is fluent. Coordination is intact. Psychiatric: Judgment and insight are intact.  Affect is appropriate. Breasts: ecchymoses in UOQ and LOQ of the left breast.  No axillary adenopathy bilaterally nor any concerning findings in the right breast.   LABORATORY DATA:  Lab Results  Component Value Date   WBC 9.2 09/15/2012   HGB 13.8 09/15/2012   HCT 42.2 09/15/2012   MCV 81.7 09/15/2012   PLT 202 09/15/2012   CMP     Component Value Date/Time   NA 144 09/15/2012 1213   NA 144 11/24/2011 0847   K 3.8 09/15/2012 1213   K 3.7 11/24/2011 0847   CL 106 09/15/2012 1213   CL 107 11/24/2011 0847   CO2 28 09/15/2012 1213   CO2 28 11/24/2011 0847   GLUCOSE 119* 09/15/2012 1213   GLUCOSE 123* 11/24/2011 0847   BUN 18.5 09/15/2012 1213   BUN 17 11/24/2011 0847   CREATININE 0.8 09/15/2012 1213   CREATININE 0.76 11/24/2011 0847   CREATININE 0.67 09/20/2010 0500   CALCIUM 9.3 09/15/2012 1213   CALCIUM 9.4 11/24/2011 0847   PROT 7.2 09/15/2012 1213   PROT 6.7 11/24/2011 0847   ALBUMIN 3.9 09/15/2012 1213   ALBUMIN 4.3 11/24/2011 0847   AST 21 09/15/2012 1213   AST 17 11/24/2011 0847   ALT 16 09/15/2012 1213   ALT 15 11/24/2011 0847   ALKPHOS 102 09/15/2012 1213   ALKPHOS 94 11/24/2011 0847   BILITOT 0.60 09/15/2012 1213   BILITOT 0.6 11/24/2011 0847   GFRNONAA >60 09/20/2010 0500   GFRAA  Value: >60        The eGFR has been calculated using the MDRD equation. This calculation has not been validated in all clinical situations. eGFR's persistently <60 mL/min signify possible Chronic Kidney Disease. 09/20/2010 0500         RADIOGRAPHY: Mr Breast Bilateral W Wo Contrast  09/10/2012   *RADIOLOGY REPORT*  Clinical Data: New diagnosis left sided breast cancer (DCIS).  BUN and creatinine were obtained on site at [facility] at [address] Results:  BUN 15 mg/dL,  Creatinine 0.8 mg/dL.  BILATERAL BREAST MRI WITH AND WITHOUT CONTRAST  Technique: Multiplanar, multisequence MR images of both breasts were obtained prior to and following the intravenous administration of 18ml of Multihance.  Three  dimensional images were evaluated at the independent DynaCad workstation.  Comparison:  No prior MRI.  Mammogram available dated 08/18/2012.  Findings: Mild parenchymal enhancement seen, left greater than right and there are foci of nonspecific enhancement bilaterally. Postbiopsy changes seen in the retroareolar region of the left breast, middle third with a biopsy clip and hematoma measuring 1.8 x 2.0 cm. Clumped and linear enhancement extends anteriorly from the biopsy cavity toward the nipple measuring 4.1 (AP) x 2.6 x 2.8 cm.  No other suspicious mass or enhancement seen in either breast. No nipple or skin enhancement is noted.  There is no axillary or internal mammary adenopathy.  Incidental note is made of a 3.8 cm pericardial cyst.  IMPRESSION: Known malignancy, left breast.  No MRI specific evidence of malignancy, right breast.  RECOMMENDATION:  Treatment plan, left breast.  THREE-DIMENSIONAL MR IMAGE RENDERING ON INDEPENDENT WORKSTATION:  Three-dimensional MR images were rendered by post-processing of the original MR data on an independent workstation.  The three- dimensional MR images were interpreted, and findings were reported in the accompanying complete MRI report for this study.  BI-RADS CATEGORY 6:  Known biopsy-proven malignancy - appropriate action should be taken.   Original Report Authenticated By: Vincenza Hews, M.D.   Mm Digital Diag Ltd L  09/03/2012   *RADIOLOGY REPORT*  Clinical Data:  Called back from screening mammogram for calcifications left breast  DIGITAL DIAGNOSTIC LEFT MAMMOGRAM Sep 03, 2012  Comparison: August 18, 2012, August 18, 2011, August 15, 2010, August 13, 2009  Findings:  ACR Breast Density Category scattered fibroglandular tissue  Spot magnification CC and lateral view of the left breast are submitted.  There are indeterminate microcalcifications in the upper outer quadrant left breast.  IMPRESSION: Suspicious findings.  RECOMMENDATION: Stereotactic core biopsy left breast  calcifications.  I have discussed the findings and recommendations with the patient. Results were also provided in writing at the conclusion of the visit.  If applicable, a reminder letter will be sent to the patient regarding her next appointment.  BI-RADS CATEGORY 4:  Suspicious abnormality - biopsy should be considered.   Original Report Authenticated By: Sherian Rein, M.D.   Mm Digital Screening  08/18/2012   *RADIOLOGY REPORT*  Clinical Data: Screening.  DIGITAL BILATERAL SCREENING MAMMOGRAM WITH CAD  Comparison:  08/18/2011, 08/15/2010, 08/13/2009, 08/07/2008, 08/06/2007, 08/04/2006, 08/01/2005  FINDINGS:  ACR Breast Density Category 2: There are scattered fibroglandular densities.  There are calcifications in the left breast.  Characterization with magnification views is recommended.  The right breast is unremarkable.  Images were processed with CAD.  IMPRESSION: Calcifications, left breast.  Additional evaluation is indicated.  RECOMMENDATION: Diagnostic mammogram of the left breast. (Code:FI-L-31M)  The patient will be contacted regarding the findings, and additional imaging will be scheduled.  BI-RADS CATEGORY 0:  Incomplete.  Need additional imaging evaluation and/or prior mammograms for comparison.   Original Report Authenticated By: Cain Saupe, M.D.   Mm Radiologist Eval And Mgmt  09/06/2012   *RADIOLOGY REPORT*  ESTABLISHED PATIENT OFFICE VISIT - LEVEL II [phone])  Chief Complaint:  Suspicious calcifications seen in the left breast on a recent screening mammogram.  History:  The patient was called back from her screening study and suspicious calcifications were identified in the left breast.  A stereotactic biopsy was performed.  Exam:  The wound site is clean and dry.  There is moderate ecchymosis.  There is no signs of infection.  Pathology: Ductal carcinoma in situ with comedo necrosis was reported histologically.  This corresponds well with the imaging findings.  The results of the biopsy  were discussed with the patient.  Informational material was provided to the patient.  Assessment and Plan:  The patient is scheduled to be seen at the [facility] on 09/15/2012.  Breast MRI has been arranged.   Original Report Authenticated By: Baird Lyons, M.D.   Mm Lt Breast Bx W Loc Dev 1st Lesion Image Bx Spec Stereo Guide  09/03/2012   *RADIOLOGY REPORT*  Clinical Data:  Indeterminate microcalcifications left breast for biopsy  STEREOTACTIC-GUIDED VACUUM ASSISTED BIOPSY OF THE LEFT BREAST AND SPECIMEN RADIOGRAPH  Comparison: Previous exams.  I met with the patient and we discussed the procedure of stereotactic-guided biopsy, including benefits and alternatives. We discussed the high likelihood of a successful procedure. We discussed the risks of  the procedure, including infection, bleeding, tissue injury, clip migration, and inadequate sampling. Informed, written consent was given.  Using sterile technique, 2% lidocaine, stereotactic guidance, and a 9 gauge vacuum assisted device, biopsy was performed of a cluster of microcalcifications in the upper left breast using a cranial approach.  Specimen radiograph was performed, showing inclusion of calcifications of concern.  Specimens with calcifications are identified for pathology.  At the conclusion of the procedure, a  tissue marker clip was deployed into the biopsy cavity.  Follow-up 2-view mammogram confirmed clip in the area of concern. There is a hematoma at the site of biopsy.  The patient is on aspirin therapy.  IMPRESSION: Stereotactic-guided biopsy of left breast.  No apparent complications.   Original Report Authenticated By: Sherian Rein, M.D.      IMPRESSION/PLAN: Lovely 77 yo with DCIS, left breast. She has met Dr. Donell Beers and is leaning towards breast conservation with possible breast lift on the opposite side, if sided.  To determine the extent ot surgery, another MRI guided biopsy will need to take place in the left breast to  determine the extent of her resection.   It was a pleasure meeting the patient today. She understands that radiotherapy may play a significant role in risk reduction for a left breast recurrence if she pursues breast conservation. I will reviewed the path results after surgery, but anticipate a role for RT based on the size, high grade, and necrosis of her DCIS.  We discussed the risks, benefits, and side effects of radiotherapy. We discussed that radiation would take approximately 6 weeks to complete and that I would give the patient a few weeks to heal following surgery before starting treatment planning. We spoke about acute effects including skin irritation and fatigue as well as much less common late effects including lung and heart irritation. We spoke about the latest technology that is used to minimize the risk of late effects for breast cancer patients undergoing radiotherapy. No guarantees of treatment were given.   The patient will think more about this adjuvant treatment. She understands that anti-hormonal therapy can also reduce the risk of recurrence, but not quite as effectively reduce the risk an in-breast recurrence as radiotherapy would.   I spent 25 minutes minutes face to face with the patient and more than 50% of that time was spent in counseling and/or coordination of care.    __________________________________________   Lonie Peak, MD

## 2012-09-15 NOTE — Assessment & Plan Note (Signed)
Patient very much desires to pursue breast conservation. If this is the case, I would like to pursue MR guided biopsy.  The region of non-mass enhancement on the MR was around 4.1 cm.  This would be a very large lumpectomy. However, she does have generous sized breasts. She would prefer to pursue first reduction on the contralateral side prior to pursuing mastectomy on the affected side.  We have scheduled the MRI biopsy. We have also scheduled referral to plastic surgery.  Once I received word about these 2 things, we'll schedule her for either for bracketed lumpectomy or a simple lumpectomy.   I reviewed the risk of surgery including bleeding, infection, damage to adjacent structures, chronic pain, seroma, need for additional surgeries, and other unforseen circumstances.   If we determine that we would have to do a mastectomy, I will see her back in clinic to discuss.  She will likely also proceed with anti hormone treatment and +/- radiation, depending on final pathology.    Examination, counseling, and coordination of care was 45 min, with >50% spent in counseling.

## 2012-09-15 NOTE — Progress Notes (Signed)
Checked in new patient and she had her [facility] forms. No financial issues.

## 2012-09-16 ENCOUNTER — Encounter: Payer: Self-pay | Admitting: Specialist

## 2012-09-16 NOTE — Progress Notes (Signed)
I met Alexandra Bowen at breast clinic; she was accompanied by her sister-in-law.  Alexandra Bowen indicated on the distress screening that her distress was a "0".  I told her about available support services and encouraged her to attend breast cancer support group.

## 2012-09-23 ENCOUNTER — Ambulatory Visit: Payer: Self-pay | Admitting: Family Medicine

## 2012-09-23 ENCOUNTER — Ambulatory Visit
Admission: RE | Admit: 2012-09-23 | Discharge: 2012-09-23 | Disposition: A | Discharge status: Home / Self Care | Payer: Medicare Other | Source: Ambulatory Visit | Attending: Family Medicine | Admitting: Family Medicine

## 2012-09-23 DIAGNOSIS — R928 Other abnormal and inconclusive findings on diagnostic imaging of breast: Secondary | ICD-10-CM

## 2012-09-23 MED ORDER — GADOBENATE DIMEGLUMINE 529 MG/ML IV SOLN
18.0000 mL | Freq: Once | INTRAVENOUS | Status: AC | PRN
  Administered 2012-09-23: 18 mL via INTRAVENOUS

## 2012-09-24 ENCOUNTER — Telehealth: Payer: Self-pay | Admitting: *Deleted

## 2012-09-24 NOTE — Telephone Encounter (Signed)
Spoke to pt concerning [facility] from 09/15/12.  Pt denies questions or concerns regarding dx or treatment care plan.  Encourage pt to call with needs.  Received verbal understanding.  Contact information given.

## 2012-09-27 ENCOUNTER — Other Ambulatory Visit (INDEPENDENT_AMBULATORY_CARE_PROVIDER_SITE_OTHER): Payer: Self-pay | Admitting: General Surgery

## 2012-09-27 ENCOUNTER — Encounter: Payer: Self-pay | Admitting: Family Medicine

## 2012-09-27 ENCOUNTER — Ambulatory Visit (INDEPENDENT_AMBULATORY_CARE_PROVIDER_SITE_OTHER): Payer: Medicare Other | Admitting: Family Medicine

## 2012-09-27 VITALS — Wt 192.0 lb

## 2012-09-27 DIAGNOSIS — C50419 Malignant neoplasm of upper-outer quadrant of unspecified female breast: Secondary | ICD-10-CM

## 2012-09-27 DIAGNOSIS — D0512 Intraductal carcinoma in situ of left breast: Secondary | ICD-10-CM

## 2012-09-27 DIAGNOSIS — C50412 Malignant neoplasm of upper-outer quadrant of left female breast: Secondary | ICD-10-CM

## 2012-09-27 NOTE — Progress Notes (Signed)
  Subjective:    Patient ID: Alexandra Bowen, female    DOB: 1935-11-06, 77 y.o.   MRN: [id number]  HPI  She is here for consultation. She was recently diagnosed with breast cancer and has gone through an evaluation for this. She is getting ready to have a lumpectomy. She is here to get my thoughts on this.   Review of Systems     Objective:   Physical Exam Alert and in no distress otherwise not examined       Assessment & Plan:  Cancer of upper-outer quadrant of female breast, left I explained that we have a very good medical community here when it comes to taking care of breast cancer and what they have set up for her so far it looks totally appropriate. I encouraged her to call me if she ever has any questions.

## 2012-09-29 ENCOUNTER — Encounter: Payer: Self-pay | Admitting: *Deleted

## 2012-09-30 ENCOUNTER — Ambulatory Visit: Admit: 2012-09-30 | Payer: Self-pay | Admitting: General Surgery

## 2012-09-30 ENCOUNTER — Telehealth: Payer: Self-pay | Admitting: *Deleted

## 2012-09-30 ENCOUNTER — Encounter (HOSPITAL_BASED_OUTPATIENT_CLINIC_OR_DEPARTMENT_OTHER): Payer: Self-pay | Admitting: *Deleted

## 2012-09-30 SURGERY — BREAST LUMPECTOMY WITH NEEDLE LOCALIZATION
Anesthesia: General | Site: Breast | Laterality: Left

## 2012-09-30 NOTE — Telephone Encounter (Signed)
sw pt gv appt for 10/21/12 @2 :15pm. Pt is aware that i will mail her a letter/cal as well....td

## 2012-09-30 NOTE — Progress Notes (Signed)
No heart or resp problems-to come in for bmet-ekg-

## 2012-10-03 NOTE — Progress Notes (Signed)
Alexandra Bowen [id number] 14-Aug-1935 77 y.o. 10/03/2012 11:13 PM  CC  Carollee Herter, MD [address] [place] [address] Dr. Almond Lint Dr. Doristine Devoid  REASON FOR CONSULTATION:  77 year old female with new diagnosis of high-grade ductal carcinoma in situ of the left breast. Patient is seen in the multidisciplinary breast clinic for discussion of treatment options.  STAGE:   Cancer of upper-outer quadrant of female breast, L DCIS   Primary site: Breast (Left)   Staging method: AJCC 7th Edition   Clinical: Stage 0 (Tis (DCIS), N0, cM0)   Summary: Stage 0 (Tis (DCIS), N0, cM0)  REFERRING PHYSICIAN: Dr. Everardo Beals  HISTORY OF PRESENT ILLNESS:  Alexandra Bowen is a 77 y.o. female.  Patient on a screening mammogram was found to have a 15 x 5 x 5 mm area of linear relief the range calcifications in the left breast. She underwent a stereotactic guided biopsy that revealed high-grade DCIS with necrosis ER positive PR positive. MRI of the breasts revealed non-masslike enhancement in the left breast measuring 4.2 x 2.6 x 2.8 cm.her case was discussed at the multidisciplinary breast conference. Her pathology and radiology were reviewed in detail. She is seen in the multidisciplinary breast clinic with Dr. Everardo Beals and Dr. Lonie Peak. Patient is without any significant complaints.   Past Medical History: Past Medical History  Diagnosis Date  . Hypertension     WHITE COAT SYNDROME  . Allergy   . Obesity   . Arthritis   . Dyslipidemia   . Glucose intolerance (impaired glucose tolerance)   . Renal stone   . 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 Breast cancer     Past Surgical History: Past Surgical History  Procedure Laterality Date  . Hammer toe surgery      both feet  . Bunionectomy      both feet  . Colonoscopy    . Cardiac catheterization  1998    routine-neg  . Dilation and curettage of uterus      Family  History: No family history on file.  Social History History  Substance Use Topics  . Smoking status: Never Smoker   . Smokeless tobacco: Never Used  . Alcohol Use: No    Allergies: No Known Allergies  Current Medications: Current Outpatient Prescriptions  Medication Sig Dispense Refill  . aspirin 81 MG tablet Take 81 mg by mouth daily.        . calcium citrate-vitamin D 200-200 MG-UNIT TABS Take 1 tablet by mouth 3 (three) times daily.        . Multiple Vitamins-Minerals (MULTIVITAMIN WITH MINERALS) tablet Take 1 tablet by mouth daily.        . simvastatin (ZOCOR) 20 MG tablet Take 1 tablet (20 mg total) by mouth at bedtime.  30 tablet  11  . amLODipine (NORVASC) 5 MG tablet Take 1 tablet (5 mg total) by mouth daily.  30 tablet  11  . bisoprolol-hydrochlorothiazide (ZIAC) 10-6.25 MG per tablet Take 1 tablet by mouth daily.  30 tablet  11  . Ibuprofen (ADVIL PO) Take by mouth as needed.      . [DISCONTINUED] potassium chloride (KLOR-CON) 10 MEQ CR tablet Take 10 mEq by mouth daily.         No current facility-administered medications for this visit.   Facility-Administered Medications Ordered in Other Visits  Medication Dose Route Frequency Provider Last Rate Last Dose  . influenza  inactive virus vaccine (FLUZONE/FLUARIX) injection 0.5 mL  0.5 mL Intramuscular Once Ronnald Nian, MD        OB/GYN History:menarche at age 46 she underwent menopause when she was 77 years old she's never been on home replacement therapy. Patient is nulliparous.  Fertility Discussion:not applicable Prior History of Cancer:no prior history  Health Maintenance:  Colonoscopy 09/30/2005 Bone Density 09/09/2005 Last PAP smear in no  ECOG PERFORMANCE STATUS: 0 - Asymptomatic  Genetic Counseling/testing:no  REVIEW OF SYSTEMS:  A comprehensive review of systems was negative.and if the scans separately into the electronic medical record  PHYSICAL EXAMINATION: Blood pressure 158/85, pulse 66,  temperature 97.6 F (36.4 C), temperature source Oral, resp. rate 20, height 5\' 6"  (1.676 m), weight 194 lb 3.2 oz (88.089 kg).  Well-developed well-nourished female in no acute distress HEENT exam EOMI PERRLA sclerae anicteric no conjunctival pallor oral mucosa is moist neck is supple lungs are clear to auscultation and percussion cardiovascular is regular rate rhythm abdomen is soft nontender no HSM extremities no edema neuro and nonfocal right breast no masses nipple discharge left breast reveals area of ecchymosis from biopsy and then a palpable thickening.    STUDIES/RESULTS: Mr Breast Bilateral W Wo Contrast  09/10/2012   *RADIOLOGY REPORT*  Clinical Data: New diagnosis left sided breast cancer (DCIS).  BUN and creatinine were obtained on site at [facility] at [address] Results:  BUN 15 mg/dL,  Creatinine 0.8 mg/dL.  BILATERAL BREAST MRI WITH AND WITHOUT CONTRAST  Technique: Multiplanar, multisequence MR images of both breasts were obtained prior to and following the intravenous administration of 18ml of Multihance.  Three dimensional images were evaluated at the independent DynaCad workstation.  Comparison:  No prior MRI.  Mammogram available dated 08/18/2012.  Findings: Mild parenchymal enhancement seen, left greater than right and there are foci of nonspecific enhancement bilaterally. Postbiopsy changes seen in the retroareolar region of the left breast, middle third with a biopsy clip and hematoma measuring 1.8 x 2.0 cm. Clumped and linear enhancement extends anteriorly from the biopsy cavity toward the nipple measuring 4.1 (AP) x 2.6 x 2.8 cm.  No other suspicious mass or enhancement seen in either breast. No nipple or skin enhancement is noted.  There is no axillary or internal mammary adenopathy.  Incidental note is made of a 3.8 cm pericardial cyst.  IMPRESSION: Known malignancy, left breast.  No MRI specific evidence of malignancy, right breast.  RECOMMENDATION: Treatment  plan, left breast.  THREE-DIMENSIONAL MR IMAGE RENDERING ON INDEPENDENT WORKSTATION:  Three-dimensional MR images were rendered by post-processing of the original MR data on an independent workstation.  The three- dimensional MR images were interpreted, and findings were reported in the accompanying complete MRI report for this study.  BI-RADS CATEGORY 6:  Known biopsy-proven malignancy - appropriate action should be taken.   Original Report Authenticated By: Vincenza Hews, M.D.   Mm Digital Diagnostic Unilat L  09/23/2012   *RADIOLOGY REPORT*  Clinical Data:  recent diagnosis of DCIS with more extensive clumped enhancement on MRI  DIGITAL DIAGNOSTIC LEFT MAMMOGRAM  Comparison:  Previous exams.  Findings:  Films are performed following MRI guided biopsy of clumped enhancment in the left breast, anterior and medial  to biopsy cavity.  Marker clip is in the anticipated position.  IMPRESSION: Appropriate clip placement.   Original Report Authenticated By: Esperanza Heir, M.D.   Mm Radiologist Eval And Mgmt  09/06/2012   *RADIOLOGY REPORT*  ESTABLISHED PATIENT OFFICE VISIT - LEVEL  II [phone])  Chief Complaint:  Suspicious calcifications seen in the left breast on a recent screening mammogram.  History:  The patient was called back from her screening study and suspicious calcifications were identified in the left breast.  A stereotactic biopsy was performed.  Exam:  The wound site is clean and dry.  There is moderate ecchymosis.  There is no signs of infection.  Pathology: Ductal carcinoma in situ with comedo necrosis was reported histologically.  This corresponds well with the imaging findings.  The results of the biopsy were discussed with the patient.  Informational material was provided to the patient.  Assessment and Plan:  The patient is scheduled to be seen at the Multidisciplinary Clinic on 09/15/2012.  Breast MRI has been arranged.   Original Report Authenticated By: Baird Lyons, M.D.   Mr Oswaldo Milian Breast Bx Jones Bales Dev 1st Lesion Image Bx Spec Mr Guide  09/24/2012   **ADDENDUM** CREATED: 09/24/2012 12:08:23  The pathology reveals benign tissue with chronic inflammatory change.  This is found to be concordant with imaging findings by Dr. Roswell Nickel.  I discussed the results over the phone with the patient.  The patient states she is doing well post biopsy without complications.  Recommendations:  64-month follow-up MRI of left breast per Dr. Roswell Nickel.  **END ADDENDUM** SIGNED BY: Sherian Rein, M.D.  09/23/2012   *RADIOLOGY REPORT*  Clinical Data:  recent diagnosis of carcinoma left breast with additional clumped and linear enhancement more anteriorly  MRI GUIDED VACUUM ASSISTED BIOPSY OF THE LEFT BREAST WITHOUT AND WITH CONTRAST  Comparison: Previous exams.  Technique: Multiplanar, multisequence MR images of the left breast were obtained prior to and following the intravenous administration of 18 ml of Mulithance.  I met with the patient, and we discussed the procedure of MRI guided biopsy, including risks, benefits, and alternatives. Specifically, we discussed the risks of infection, bleeding, tissue injury, clip migration, and inadequate sampling.  Informed, written consent was given.  Using sterile technique, 2% Lidocaine, MRI guidance, and a 9 gauge vacuum assisted device, biopsy was performed of the suspicious enhancement using a lateral-medial approach.  At the conclusion of the procedure, a  tissue marker clip was deployed into the biopsy cavity.  IMPRESSION: MRI guided biopsy of enhancement in the left breast located anteriorly and medially to recent biopsy cavity. No apparent complications.  THREE-DIMENSIONAL MR IMAGE RENDERING ON INDEPENDENT WORKSTATION:  Three-dimensional MR images were rendered by post-processing of the original MR data on an independent workstation.  The three- dimensional MR images were interpreted, and findings were reported in the accompanying complete MRI report for this study.   Original Report  Authenticated By: Esperanza Heir, M.D.     LABS:    Chemistry      Component Value Date/Time   NA 144 09/15/2012 1213   NA 144 11/24/2011 0847   K 3.8 09/15/2012 1213   K 3.7 11/24/2011 0847   CL 106 09/15/2012 1213   CL 107 11/24/2011 0847   CO2 28 09/15/2012 1213   CO2 28 11/24/2011 0847   BUN 18.5 09/15/2012 1213   BUN 17 11/24/2011 0847   CREATININE 0.8 09/15/2012 1213   CREATININE 0.76 11/24/2011 0847   CREATININE 0.67 09/20/2010 0500      Component Value Date/Time   CALCIUM 9.3 09/15/2012 1213   CALCIUM 9.4 11/24/2011 0847   ALKPHOS 102 09/15/2012 1213   ALKPHOS 94 11/24/2011 0847   AST 21 09/15/2012 1213   AST 17 11/24/2011 0847  ALT 16 09/15/2012 1213   ALT 15 11/24/2011 0847   BILITOT 0.60 09/15/2012 1213   BILITOT 0.6 11/24/2011 0847      Lab Results  Component Value Date   WBC 9.2 09/15/2012   HGB 13.8 09/15/2012   HCT 42.2 09/15/2012   MCV 81.7 09/15/2012   PLT 202 09/15/2012       PATHOLOGY: ADDITIONAL INFORMATION: PROGNOSTIC INDICATORS - ACIS Results: IMMUNOHISTOCHEMICAL AND MORPHOMETRIC ANALYSIS BY THE AUTOMATED CELLULAR IMAGING SYSTEM (ACIS) Estrogen Receptor: 100%, POSITIVE, STRONG STAINING INTENSITY Progesterone Receptor: 8%, POSITIVE, WEAK STAINING INTENSITY REFERENCE RANGE ESTROGEN RECEPTOR NEGATIVE <1% POSITIVE =>1% PROGESTERONE RECEPTOR NEGATIVE <1% POSITIVE =>1% All controls stained appropriately Pecola Leisure MD Pathologist, Electronic Signature ( Signed 09/08/2012) FINAL DIAGNOSIS Diagnosis Breast, left, needle core biopsy - DUCTAL CARCINOMA IN SITU WITH COMEDO NECROSIS, SEE COMMENT. 1 of 2 FINAL for KORAL, THADEN [phone]) Microscopic Comment Although grade of tumor is best assessed at resection, in these biopsies, the in situ carcinoma is grade III. Breast prognostic studies are pending and will be reported in an addendum. The case was reviewed with Dr. Raynald Blend who concurs. (CR:kh 09-06-12) [place] RUND DO Pathologist, Electronic  Signature (Case signed 09/06/2012)  ASSESSMENT    77 year old female with  #1 on screening mammogram found to have linear calcifications. MRI revealed significant disease. The biopsy done stereotactically reveals a high-grade ductal carcinoma in situ that is ER positive PR positive. Today I discussed the diagnosis treatment of breast cancer with the patient. We went over the pathology in detail as well as her radiology. She would desire breast conservation. However we did discuss the extent of disease found on her MRI. I do think additional biopsies would be port and to see whether there is significant disease or there are benign areas noted. We discussed biopsy. She will be set up for this on 09/23/2012. If this comes back negative then she would be a candidate for breast conservation.  #2 we discussed rationale for adjuvant therapy consisting of tamoxifen since patient's tumor is ER positive. She understands this would be preventative fourth future breast cancer risk reduction. We discussed some side effects of tamoxifen included but not limited to hot flashes, blood clots, endometrial cancer risk.  #3 she was seen by Dr. Everardo Beals as well as Dr. Lonie Peak. They discussed their respective treatment options including surgery as well as radiation respectively appear  Clinical Trial Eligibility:yes B. 43 Multidisciplinary conference discussion yes     PLAN:    #1 proceed with her second biopsy in the left breast centrally.  #2 once biopsy results are known she will proceed with her lumpectomy versus a mastectomy by Dr. Everardo Beals.  #3 once patient has had her definitive surgery I will see her back for discussion of adjuvant therapy including radiation therapy and antiestrogen therapy. Certainly she would also be a candidate for NSABP B. 43 clinical study utilizing Herceptin in DCIS after HER-2/neu testing        Discussion: Patient is being treated per NCCN breast cancer care  guidelines appropriate for stage.0   Thank you so much for allowing me to participate in the care of Kyana J Eslick. I will continue to follow up the patient with you and assist in her care.  All questions were answered. The patient knows to call the clinic with any problems, questions or concerns. We can certainly see the patient much sooner if necessary.  I spent 55 minutes counseling the patient face to face. The total  time spent in the appointment was 60 minutes.  Drue Second, MD Medical/Oncology [facility] [phone] (beeper) [phone] (Office)

## 2012-10-04 ENCOUNTER — Encounter (HOSPITAL_BASED_OUTPATIENT_CLINIC_OR_DEPARTMENT_OTHER)
Admission: RE | Admit: 2012-10-04 | Discharge: 2012-10-04 | Disposition: A | Discharge status: Home / Self Care | Payer: Medicare Other | Source: Ambulatory Visit | Attending: General Surgery | Admitting: General Surgery

## 2012-10-04 ENCOUNTER — Other Ambulatory Visit: Payer: Self-pay

## 2012-10-04 LAB — BASIC METABOLIC PANEL
BUN: 17 mg/dL (ref 6–23)
Chloride: 106 mEq/L (ref 96–112)
Creatinine, Ser: 0.77 mg/dL (ref 0.50–1.10)
GFR calc Af Amer: >90 mL/min (ref >90)
GFR calc non Af Amer: 81 mL/min — ABNORMAL LOW (ref >90)
Potassium: 3.9 mEq/L (ref 3.5–5.1)

## 2012-10-06 ENCOUNTER — Encounter (HOSPITAL_BASED_OUTPATIENT_CLINIC_OR_DEPARTMENT_OTHER): Payer: Self-pay | Admitting: Anesthesiology

## 2012-10-06 ENCOUNTER — Encounter (HOSPITAL_BASED_OUTPATIENT_CLINIC_OR_DEPARTMENT_OTHER)
Admission: RE | Disposition: A | Discharge status: Home / Self Care | Payer: Self-pay | Source: Ambulatory Visit | Attending: General Surgery

## 2012-10-06 ENCOUNTER — Ambulatory Visit (HOSPITAL_BASED_OUTPATIENT_CLINIC_OR_DEPARTMENT_OTHER)
Admission: RE | Admit: 2012-10-06 | Discharge: 2012-10-06 | Disposition: A | Discharge status: Home / Self Care | Payer: Medicare Other | Source: Ambulatory Visit | Attending: General Surgery | Admitting: General Surgery

## 2012-10-06 ENCOUNTER — Telehealth (INDEPENDENT_AMBULATORY_CARE_PROVIDER_SITE_OTHER): Payer: Self-pay | Admitting: General Surgery

## 2012-10-06 ENCOUNTER — Ambulatory Visit
Admission: RE | Admit: 2012-10-06 | Discharge: 2012-10-06 | Disposition: A | Discharge status: Home / Self Care | Payer: Medicare Other | Source: Ambulatory Visit | Attending: General Surgery | Admitting: General Surgery

## 2012-10-06 ENCOUNTER — Ambulatory Visit (HOSPITAL_BASED_OUTPATIENT_CLINIC_OR_DEPARTMENT_OTHER): Payer: Medicare Other | Admitting: Anesthesiology

## 2012-10-06 DIAGNOSIS — Z87442 Personal history of urinary calculi: Secondary | ICD-10-CM | POA: Insufficient documentation

## 2012-10-06 DIAGNOSIS — D0590 Unspecified type of carcinoma in situ of unspecified breast: Secondary | ICD-10-CM | POA: Insufficient documentation

## 2012-10-06 DIAGNOSIS — E669 Obesity, unspecified: Secondary | ICD-10-CM | POA: Insufficient documentation

## 2012-10-06 DIAGNOSIS — I10 Essential (primary) hypertension: Secondary | ICD-10-CM | POA: Insufficient documentation

## 2012-10-06 DIAGNOSIS — E7800 Pure hypercholesterolemia, unspecified: Secondary | ICD-10-CM | POA: Insufficient documentation

## 2012-10-06 DIAGNOSIS — D0512 Intraductal carcinoma in situ of left breast: Secondary | ICD-10-CM

## 2012-10-06 DIAGNOSIS — M129 Arthropathy, unspecified: Secondary | ICD-10-CM | POA: Insufficient documentation

## 2012-10-06 HISTORY — PX: BREAST LUMPECTOMY WITH NEEDLE LOCALIZATION: SHX5759

## 2012-10-06 HISTORY — PX: BREAST SURGERY: SHX581

## 2012-10-06 SURGERY — BREAST LUMPECTOMY WITH NEEDLE LOCALIZATION
Anesthesia: General | Site: Breast | Laterality: Left | Wound class: Clean

## 2012-10-06 MED ORDER — ONDANSETRON 4 MG PO TBDP: 4.0000 mg | ORAL_TABLET | Freq: Four times a day (QID) | ORAL | Status: DC | PRN

## 2012-10-06 MED ORDER — LACTATED RINGERS IV SOLN
INTRAVENOUS | Status: DC
  Administered 2012-10-06 (×2): via INTRAVENOUS

## 2012-10-06 MED ORDER — HYDROMORPHONE HCL PF 1 MG/ML IJ SOLN: 0.2500 mg | INTRAMUSCULAR | Status: DC | PRN

## 2012-10-06 MED ORDER — SODIUM CHLORIDE 0.9 % IJ SOLN: 3.0000 mL | INTRAMUSCULAR | Status: DC | PRN

## 2012-10-06 MED ORDER — SODIUM CHLORIDE 0.9 % IJ SOLN: 3.0000 mL | Freq: Two times a day (BID) | INTRAMUSCULAR | Status: DC

## 2012-10-06 MED ORDER — PROPOFOL 10 MG/ML IV BOLUS
INTRAVENOUS | Status: DC | PRN
  Administered 2012-10-06: 150 mg via INTRAVENOUS

## 2012-10-06 MED ORDER — ONDANSETRON HCL 4 MG/2ML IJ SOLN: 4.0000 mg | Freq: Four times a day (QID) | INTRAMUSCULAR | Status: DC | PRN

## 2012-10-06 MED ORDER — KETOROLAC TROMETHAMINE 15 MG/ML IJ SOLN
INTRAMUSCULAR | Status: DC | PRN
  Administered 2012-10-06: 15 mg via INTRAVENOUS

## 2012-10-06 MED ORDER — CHLORHEXIDINE GLUCONATE 4 % EX LIQD: 1.0000 "application " | Freq: Once | CUTANEOUS | Status: DC

## 2012-10-06 MED ORDER — OXYCODONE HCL 5 MG PO TABS: 5.0000 mg | ORAL_TABLET | ORAL | Status: DC | PRN

## 2012-10-06 MED ORDER — METOCLOPRAMIDE HCL 5 MG/ML IJ SOLN
INTRAMUSCULAR | Status: DC | PRN
  Administered 2012-10-06: 10 mg via INTRAVENOUS

## 2012-10-06 MED ORDER — BUPIVACAINE HCL (PF) 0.25 % IJ SOLN
INTRAMUSCULAR | Status: DC | PRN
  Administered 2012-10-06: 20 mL

## 2012-10-06 MED ORDER — FENTANYL CITRATE 0.05 MG/ML IJ SOLN
INTRAMUSCULAR | Status: DC | PRN
  Administered 2012-10-06: 50 ug via INTRAVENOUS
  Administered 2012-10-06: 25 ug via INTRAVENOUS

## 2012-10-06 MED ORDER — CEFAZOLIN SODIUM-DEXTROSE 2-3 GM-% IV SOLR
2.0000 g | INTRAVENOUS | Status: AC
  Administered 2012-10-06: 2 g via INTRAVENOUS

## 2012-10-06 MED ORDER — SODIUM CHLORIDE 0.9 % IV SOLN: 250.0000 mL | INTRAVENOUS | Status: DC | PRN

## 2012-10-06 MED ORDER — ACETAMINOPHEN 325 MG PO TABS: 650.0000 mg | ORAL_TABLET | ORAL | Status: DC | PRN

## 2012-10-06 MED ORDER — ACETAMINOPHEN 650 MG RE SUPP: 650.0000 mg | RECTAL | Status: DC | PRN

## 2012-10-06 MED ORDER — ACETAMINOPHEN 10 MG/ML IV SOLN
1000.0000 mg | Freq: Once | INTRAVENOUS | Status: AC
  Administered 2012-10-06: 1000 mg via INTRAVENOUS

## 2012-10-06 MED ORDER — METOCLOPRAMIDE HCL 5 MG/ML IJ SOLN: 10.0000 mg | Freq: Once | INTRAMUSCULAR | Status: DC | PRN

## 2012-10-06 MED ORDER — OXYCODONE HCL 5 MG PO TABS: 5.0000 mg | ORAL_TABLET | Freq: Once | ORAL | Status: DC | PRN

## 2012-10-06 MED ORDER — LIDOCAINE HCL (CARDIAC) 20 MG/ML IV SOLN
INTRAVENOUS | Status: DC | PRN
  Administered 2012-10-06: 60 mg via INTRAVENOUS

## 2012-10-06 MED ORDER — HYDROCODONE-ACETAMINOPHEN 5-325 MG PO TABS: 1.0000 | ORAL_TABLET | Freq: Four times a day (QID) | ORAL | Status: DC | PRN

## 2012-10-06 MED ORDER — GLYCOPYRROLATE 0.2 MG/ML IJ SOLN
INTRAMUSCULAR | Status: DC | PRN
  Administered 2012-10-06: 0.2 mg via INTRAVENOUS

## 2012-10-06 MED ORDER — DEXAMETHASONE SODIUM PHOSPHATE 4 MG/ML IJ SOLN
INTRAMUSCULAR | Status: DC | PRN
  Administered 2012-10-06: 4 mg via INTRAVENOUS

## 2012-10-06 MED ORDER — OXYCODONE HCL 5 MG/5ML PO SOLN: 5.0000 mg | Freq: Once | ORAL | Status: DC | PRN

## 2012-10-06 SURGICAL SUPPLY — 46 items
BLADE HEX COATED 2.75 (ELECTRODE) ×2 IMPLANT
BLADE SURG 15 STRL LF DISP TIS (BLADE) ×1 IMPLANT
BLADE SURG 15 STRL SS (BLADE) ×1
CANISTER SUCTION 1200CC (MISCELLANEOUS) ×2 IMPLANT
CHLORAPREP W/TINT 26ML (MISCELLANEOUS) ×2 IMPLANT
CLIP TI LARGE 6 (CLIP) ×2 IMPLANT
CLOTH BEACON ORANGE TIMEOUT ST (SAFETY) ×2 IMPLANT
COVER MAYO STAND STRL (DRAPES) ×2 IMPLANT
COVER TABLE BACK 60X90 (DRAPES) ×2 IMPLANT
DECANTER SPIKE VIAL GLASS SM (MISCELLANEOUS) IMPLANT
DEVICE DUBIN W/COMP PLATE 8390 (MISCELLANEOUS) ×2 IMPLANT
DRAPE PED LAPAROTOMY (DRAPES) ×2 IMPLANT
DRAPE UTILITY XL STRL (DRAPES) ×2 IMPLANT
DRSG PAD ABDOMINAL 8X10 ST (GAUZE/BANDAGES/DRESSINGS) ×2 IMPLANT
ELECT REM PT RETURN 9FT ADLT (ELECTROSURGICAL) ×2
ELECTRODE REM PT RTRN 9FT ADLT (ELECTROSURGICAL) ×1 IMPLANT
GAUZE SPONGE 4X4 12PLY STRL LF (GAUZE/BANDAGES/DRESSINGS) IMPLANT
GLOVE BIO SURGEON STRL SZ 6 (GLOVE) ×2 IMPLANT
GLOVE BIOGEL PI IND STRL 6.5 (GLOVE) ×1 IMPLANT
GLOVE BIOGEL PI INDICATOR 6.5 (GLOVE) ×1
GLOVE ECLIPSE 6.5 STRL STRAW (GLOVE) ×2 IMPLANT
GOWN PREVENTION PLUS XLARGE (GOWN DISPOSABLE) ×4 IMPLANT
GOWN PREVENTION PLUS XXLARGE (GOWN DISPOSABLE) ×2 IMPLANT
KIT MARKER MARGIN INK (KITS) ×2 IMPLANT
NEEDLE HYPO 25X1 1.5 SAFETY (NEEDLE) ×2 IMPLANT
NS IRRIG 1000ML POUR BTL (IV SOLUTION) ×2 IMPLANT
PACK BASIN DAY SURGERY FS (CUSTOM PROCEDURE TRAY) ×2 IMPLANT
PENCIL BUTTON HOLSTER BLD 10FT (ELECTRODE) ×2 IMPLANT
SLEEVE SCD COMPRESS KNEE MED (MISCELLANEOUS) ×2 IMPLANT
SPONGE GAUZE 4X4 12PLY (GAUZE/BANDAGES/DRESSINGS) ×2 IMPLANT
SPONGE GAUZE 4X4 16PLY UNSTER (WOUND CARE) ×2 IMPLANT
SPONGE LAP 18X18 X RAY DECT (DISPOSABLE) ×2 IMPLANT
STAPLER VISISTAT 35W (STAPLE) IMPLANT
STRIP CLOSURE SKIN 1/2X4 (GAUZE/BANDAGES/DRESSINGS) IMPLANT
SUT MON AB 4-0 PC3 18 (SUTURE) ×2 IMPLANT
SUT SILK 2 0 SH (SUTURE) IMPLANT
SUT VIC AB 3-0 54X BRD REEL (SUTURE) IMPLANT
SUT VIC AB 3-0 BRD 54 (SUTURE)
SUT VIC AB 3-0 SH 27 (SUTURE) ×1
SUT VIC AB 3-0 SH 27X BRD (SUTURE) ×1 IMPLANT
SYR BULB 3OZ (MISCELLANEOUS) ×2 IMPLANT
SYR CONTROL 10ML LL (SYRINGE) ×2 IMPLANT
TOWEL OR 17X24 6PK STRL BLUE (TOWEL DISPOSABLE) ×2 IMPLANT
TOWEL OR NON WOVEN STRL DISP B (DISPOSABLE) IMPLANT
TUBE CONNECTING 20X1/4 (TUBING) ×2 IMPLANT
YANKAUER SUCT BULB TIP NO VENT (SUCTIONS) ×2 IMPLANT

## 2012-10-06 NOTE — Op Note (Signed)
Left Breast Needle localized Lumpectomy  Procedure Note  Indications: This patient presents with history of left breast cancer (cTis)   Pre-operative Diagnosis: left breast cancer  Post-operative Diagnosis: left breast cancer  Surgeon: Almond Lint   Assistants: n/a  Anesthesia: General LMA anesthesia and Local anesthesia 1% buffered lidocaine, 0.25.% bupivacaine, with epinephrine  Procedure Details  The patient was seen in the Holding Room. The risks, benefits, complications, treatment options, and expected outcomes were discussed with the patient. The possibilities of reaction to medication, pulmonary aspiration, bleeding, infection, the need for additional procedures, failure to diagnose a condition, and creating a complication requiring transfusion or operation were discussed with the patient. The patient concurred with the proposed plan, giving informed consent.  The site of surgery properly noted/marked. The patient was taken to Operating Room # 3, identified as Alexandra Bowen and the procedure verified as Breast Lumpectomy and Sentinel Node Biopsy. A Time Out was held and the above information confirmed.  After induction of anesthesia, the left breast and chest were prepped and draped in standard fashion.     The lumpectomy was performed by creating an oblique incision over the upper outer quadrant of the breastaround the previously placed localization guidewire.  There was a small area of skin dimpling.  This was excised.  Dissection was carried down to the pectoral fascia.  Orientation sutures were placed and the skin and pectoralis margins were inked.  Specimen radiography confirmed inclusion of the mammographic lesion.  Hemostasis was achieved with cautery.  Additional tissue was taken along the medial, lateral and inferior margin and submitted separately to pathology after providing orientation for the pathology.  The wound was irrigated and closed with a 3-0 Vicryl deep dermal  interrupted and a 4-0 Monocryl subcuticular closure in layers.    Sterile dressings were applied. At the end of the operation, all sponge, instrument, and needle counts were correct.  Findings: grossly clear surgical margins, clip in specimen mammogram, anterior margin skin.  Posterior margin pectoralis fascia.  Estimated Blood Loss:  Minimal         Drains: none         Specimens: left breast lumpectomy, additional medial margin, additional inferior margin, additional lateral margin.                  Complications:  None; patient tolerated the procedure well.         Disposition: PACU - hemodynamically stable.         Condition: stable

## 2012-10-06 NOTE — Anesthesia Procedure Notes (Signed)
Procedure Name: LMA Insertion Date/Time: 10/06/2012 12:37 PM Performed by: Gar Gibbon Pre-anesthesia Checklist: Patient identified, Emergency Drugs available, Suction available and Patient being monitored Patient Re-evaluated:Patient Re-evaluated prior to inductionOxygen Delivery Method: Circle System Utilized Preoxygenation: Pre-oxygenation with 100% oxygen Intubation Type: IV induction Ventilation: Mask ventilation without difficulty LMA: LMA inserted LMA Size: 4.0 Number of attempts: 1 Airway Equipment and Method: bite block Placement Confirmation: positive ETCO2 Tube secured with: Tape Dental Injury: Teeth and Oropharynx as per pre-operative assessment

## 2012-10-06 NOTE — H&P (View-Only) (Signed)
Chief complaint:  Left breast DCIS  HISTORY: Patient is a 77 year old female who presented with screen detected left breast calcifications. On mammogram and ultrasound these were 1.5 x 0.5 x 0.5 cm. This is linear in arrangement.  Biopsy demonstrated high-grade DCIS with necrosis. Hormone receptor status was positive estrogen and  positive progesterone.  She subsequently underwent MRI which demonstrated a much wider area of non-mass enhancement.  This whole area was 4.1 x 2.8 cm.  She denied breast pain prior to the breast biopsy. She did have significant bruising with the biopsy.  She does not on any family history of cancer. She has had some nonmelanoma skin cancer on the face. He is retired. She is accompanied by her sister-in-law. She had menarche at age 60. Last period was around 28 years ago. She did not never used hormone replacement. She has had a colonoscopy and a bone density study.  Past Medical History  Diagnosis Date  . Hypertension     WHITE COAT SYNDROME  . Allergy   . Obesity   . Arthritis   . Dyslipidemia   . Glucose intolerance (impaired glucose tolerance)   . Renal stone   . 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 Breast cancer     No past surgical history on file.  Current Outpatient Prescriptions  Medication Sig Dispense Refill  . amLODipine (NORVASC) 5 MG tablet Take 1 tablet (5 mg total) by mouth daily.  30 tablet  11  . amLODipine (NORVASC) 5 MG tablet TAKE 1 TABLET BY MOUTH DAILY  30 tablet  10  . aspirin 81 MG tablet Take 81 mg by mouth daily.        . bisoprolol-hydrochlorothiazide (ZIAC) 10-6.25 MG per tablet Take 1 tablet by mouth daily.  30 tablet  11  . bisoprolol-hydrochlorothiazide (ZIAC) 10-6.25 MG per tablet TAKE 1 TABLET BY MOUTH DAILY  30 tablet  10  . calcium citrate-vitamin D 200-200 MG-UNIT TABS Take 1 tablet by mouth 3 (three) times daily.        . Ibuprofen (ADVIL PO) Take by mouth as needed.      . Multiple  Vitamins-Minerals (MULTIVITAMIN WITH MINERALS) tablet Take 1 tablet by mouth daily.        . simvastatin (ZOCOR) 20 MG tablet Take 1 tablet (20 mg total) by mouth at bedtime.  30 tablet  11  . simvastatin (ZOCOR) 20 MG tablet TAKE 1 TABLET BY MOUTH AT BEDTIME  30 tablet  0  . [DISCONTINUED] potassium chloride (KLOR-CON) 10 MEQ CR tablet Take 10 mEq by mouth daily.         No current facility-administered medications for this visit.   Facility-Administered Medications Ordered in Other Visits  Medication Dose Route Frequency Provider Last Rate Last Dose  . influenza  inactive virus vaccine (FLUZONE/FLUARIX) injection 0.5 mL  0.5 mL Intramuscular Once Ronnald Nian, MD         No Known Allergies   No family history on file.   History   Social History  . Marital Status: Widowed    Spouse Name: N/A    Number of Children: N/A  . Years of Education: N/A   Social History Main Topics  . Smoking status: Never Smoker   . Smokeless tobacco: Never Used  . Alcohol Use: No  . Drug Use: No  . Sexually Active: Not Currently   Other Topics Concern  . Not on file   Social  History Narrative  . No narrative on file     REVIEW OF SYSTEMS - PERTINENT POSITIVES ONLY: 12 point review of systems negative other than HPI and PMH except for low back pain, top dentures, heartburn, arthritis  EXAM: Wt Readings from Last 3 Encounters:  09/15/12 194 lb 3.2 oz (88.089 kg)  11/24/11 196 lb (88.905 kg)  07/28/11 203 lb (92.08 kg)   Temp Readings from Last 3 Encounters:  09/15/12 97.6 F (36.4 C) Oral  07/17/11 98.4 F (36.9 C) Oral   BP Readings from Last 3 Encounters:  09/15/12 158/85  11/24/11 142/90  07/28/11 126/80   Pulse Readings from Last 3 Encounters:  09/15/12 66  11/24/11 78  07/28/11 61    Gen:  No acute distress.  Well nourished and well groomed.   Neurological: Alert and oriented to person, place, and time. Coordination normal.  Head: Normocephalic and atraumatic.   Eyes: Conjunctivae are normal. Pupils are equal, round, and reactive to light. No scleral icterus.  Neck: Normal range of motion. Neck supple. No tracheal deviation or thyromegaly present.  Cardiovascular: Normal rate, regular rhythm, normal heart sounds and intact distal pulses.  Exam reveals no gallop and no friction rub.  No murmur heard. Respiratory: Effort normal.  No respiratory distress. No chest wall tenderness. Breath sounds normal.  No wheezes, rales or rhonchi.  Breast:  Left breast with bruising in the upper outer quadrant.  No mass, no skin dimpling or nipple discharge/retraction.  No axillary lymphadenopathy.  No left sided findings.  Breasts are symmetric and ptotic bilaterally.   GI: Soft. Bowel sounds are normal. The abdomen is soft and nontender.  There is no rebound and no guarding.  Musculoskeletal: Normal range of motion. Extremities are nontender.  Lymphadenopathy: No cervical, preauricular, postauricular or axillary adenopathy is present Skin: Skin is warm and dry. No rash noted. No diaphoresis. No erythema. No pallor. No clubbing, cyanosis, or edema.  There are some shiny areas on her face in sites of biopsies.   Psychiatric: Normal mood and affect. Behavior is normal. Judgment and thought content normal.    LABORATORY RESULTS: Available labs are reviewed  CBC normal, cmet normal other than elevated glucose.    RADIOLOGY RESULTS: See E-Chart or I-Site for most recent results.  Images and reports are reviewed.  Mammogram IMPRESSION:  Calcifications, left breast. Additional evaluation is indicated.   MR Findings: Mild parenchymal enhancement seen, left greater than  right and there are foci of nonspecific enhancement bilaterally.  Postbiopsy changes seen in the retroareolar region of the left  breast, middle third with a biopsy clip and hematoma measuring 1.8  x 2.0 cm. Clumped and linear enhancement extends anteriorly from  the biopsy cavity toward the nipple  measuring 4.1 (AP) x 2.6 x 2.8  cm. No other suspicious mass or enhancement seen in either breast.  No nipple or skin enhancement is noted. There is no axillary or  internal mammary adenopathy. Incidental note is made of a 3.8 cm  pericardial cyst.  IMPRESSION: Known malignancy, left breast. No MRI specific  evidence of malignancy, right breast.     ASSESSMENT AND PLAN: Cancer of upper-outer quadrant of female breast, L DCIS Patient very much desires to pursue breast conservation. If this is the case, I would like to pursue MR guided biopsy.  The region of non-mass enhancement on the MR was around 4.1 cm.  This would be a very large lumpectomy. However, she does have generous sized breasts. She would prefer  to pursue first reduction on the contralateral side prior to pursuing mastectomy on the affected side.  We have scheduled the MRI biopsy. We have also scheduled referral to plastic surgery.  Once I received word about these 2 things, we'll schedule her for either for bracketed lumpectomy or a simple lumpectomy.   I reviewed the risk of surgery including bleeding, infection, damage to adjacent structures, chronic pain, seroma, need for additional surgeries, and other unforseen circumstances.   If we determine that we would have to do a mastectomy, I will see her back in clinic to discuss.  She will likely also proceed with anti hormone treatment and +/- radiation, depending on final pathology.    Examination, counseling, and coordination of care was 45 min, with >50% spent in counseling.       Maudry Diego MD Surgical Oncology, General and Endocrine Surgery [facility]      Visit Diagnoses: 1. Cancer of upper-outer quadrant of female breast, left     Primary Care Physician: Carollee Herter, MD

## 2012-10-06 NOTE — Transfer of Care (Signed)
Immediate Anesthesia Transfer of Care Note  Patient: Alexandra Bowen  Procedure(s) Performed: Procedure(s) with comments: LEFT BREAST LUMPECTOMY WITH NEEDLE LOCALIZATION (Left) - needle localization at 10:30 breast center of [place]   Patient Location: PACU  Anesthesia Type:General  Level of Consciousness: awake, sedated and patient cooperative  Airway & Oxygen Therapy: Patient Spontanous Breathing and Patient connected to face mask oxygen  Post-op Assessment: Report given to PACU RN and Post -op Vital signs reviewed and stable  Post vital signs: Reviewed and stable  Complications: No apparent anesthesia complications

## 2012-10-06 NOTE — Interval H&P Note (Signed)
History and Physical Interval Note:  10/06/2012 12:06 PM  Alexandra Bowen  has presented today for surgery, with the diagnosis of breast cancer DCIS   The various methods of treatment have been discussed with the patient and family. After consideration of risks, benefits and other options for treatment, the patient has consented to  Procedure(s) with comments: LEFT BREAST LUMPECTOMY WITH NEEDLE LOCALIZATION (Left) .  The patient's history has been reviewed, patient examined, no change in status, stable for surgery.  I have reviewed the patient's chart and labs.  Questions were answered to the patient's satisfaction.     Nike Southers

## 2012-10-06 NOTE — Anesthesia Postprocedure Evaluation (Signed)
Anesthesia Post Note  Patient: Alexandra Bowen  Procedure(s) Performed: Procedure(s) (LRB): LEFT BREAST LUMPECTOMY WITH NEEDLE LOCALIZATION (Left)  Anesthesia type: General  Patient location: PACU  Post pain: Pain level controlled  Post assessment: Patient's Cardiovascular Status Stable  Last Vitals:  Filed Vitals:   10/06/12 1430  BP: 127/56  Pulse: 60  Temp:   Resp: 15    Post vital signs: Reviewed and stable  Level of consciousness: alert  Complications: No apparent anesthesia complications

## 2012-10-06 NOTE — Anesthesia Preprocedure Evaluation (Signed)
Anesthesia Evaluation  Patient identified by MRN, date of birth, ID band Patient awake    Reviewed: Allergy & Precautions, H&P , NPO status , Patient's Chart, lab work & pertinent test results, reviewed documented beta blocker date and time   Airway Mallampati: II TM Distance: >3 FB Neck ROM: full    Dental   Pulmonary neg pulmonary ROS,  breath sounds clear to auscultation        Cardiovascular hypertension, On Medications and On Home Beta Blockers Rhythm:regular     Neuro/Psych negative neurological ROS  negative psych ROS   GI/Hepatic negative GI ROS, Neg liver ROS,   Endo/Other  negative endocrine ROS  Renal/GU Renal disease  negative genitourinary   Musculoskeletal   Abdominal   Peds  Hematology negative hematology ROS (+)   Anesthesia Other Findings See surgeon's H&P   Reproductive/Obstetrics negative OB ROS                           Anesthesia Physical Anesthesia Plan  ASA: II  Anesthesia Plan: General   Post-op Pain Management:    Induction:   Airway Management Planned: LMA  Additional Equipment:   Intra-op Plan:   Post-operative Plan:   Informed Consent: I have reviewed the patients History and Physical, chart, labs and discussed the procedure including the risks, benefits and alternatives for the proposed anesthesia with the patient or authorized representative who has indicated his/her understanding and acceptance.   Dental Advisory Given  Plan Discussed with: CRNA and Surgeon  Anesthesia Plan Comments:         Anesthesia Quick Evaluation

## 2012-10-07 ENCOUNTER — Encounter (HOSPITAL_BASED_OUTPATIENT_CLINIC_OR_DEPARTMENT_OTHER): Payer: Self-pay | Admitting: General Surgery

## 2012-10-07 NOTE — Telephone Encounter (Signed)
Opened in error. No call made.

## 2012-10-13 ENCOUNTER — Telehealth: Payer: Self-pay | Admitting: *Deleted

## 2012-10-13 NOTE — Telephone Encounter (Signed)
Pt requested for me to cancel her ov on 10/21/12 and genetic appt for 10/28/12. She do not want to attend any appts until she sees Dr.Byerly. i cancel her ov appt for 10/21/12, and sent LM and Dawn a email to cancel her genetic...td

## 2012-10-15 ENCOUNTER — Telehealth: Payer: Self-pay | Admitting: *Deleted

## 2012-10-15 NOTE — Telephone Encounter (Signed)
Lm gv appt d/t for 10/27/12@11 :45am. Made pt aware that i will mail a letter/cal as well...td

## 2012-10-19 ENCOUNTER — Telehealth: Payer: Self-pay | Admitting: Oncology

## 2012-10-21 ENCOUNTER — Encounter: Payer: Self-pay | Admitting: *Deleted

## 2012-10-21 ENCOUNTER — Ambulatory Visit: Payer: Medicare Other | Admitting: Oncology

## 2012-10-21 NOTE — Progress Notes (Signed)
Per Ezzard Standing - pt called requesting to cancel office visit and genetic appts.  Ezzard Standing cancelled the office visit and I cancelled the genetic and lab appts.

## 2012-10-25 ENCOUNTER — Ambulatory Visit (INDEPENDENT_AMBULATORY_CARE_PROVIDER_SITE_OTHER): Payer: Medicare Other | Admitting: General Surgery

## 2012-10-25 ENCOUNTER — Encounter (INDEPENDENT_AMBULATORY_CARE_PROVIDER_SITE_OTHER): Payer: Self-pay | Admitting: General Surgery

## 2012-10-25 VITALS — BP 130/84 | HR 70 | Temp 98.1°F | Resp 16 | Ht 66.0 in | Wt 192.8 lb

## 2012-10-25 DIAGNOSIS — C50412 Malignant neoplasm of upper-outer quadrant of left female breast: Secondary | ICD-10-CM

## 2012-10-25 DIAGNOSIS — C50419 Malignant neoplasm of upper-outer quadrant of unspecified female breast: Secondary | ICD-10-CM

## 2012-10-25 NOTE — Assessment & Plan Note (Signed)
No evidence of surgical complications.   Reviewed rational for antihormonal therapy, but patient adamant that she does not want to take anything.  She does not want follow up with oncologist.   I will see her back in 4 months for clinical exam.

## 2012-10-25 NOTE — Progress Notes (Signed)
HISTORY: Pt doing well.  She did not use any narcotics.  Pain was manageable.  She denies fevers/ chills.      EXAM: General:  Alert and oriented.   Incision:  Healing well.     PATHOLOGY: Dcis. Margins negative.     ASSESSMENT AND PLAN:   Cancer of upper-outer quadrant of female breast, L DCIS No evidence of surgical complications.   Reviewed rational for antihormonal therapy, but patient adamant that she does not want to take anything.  She does not want follow up with oncologist.   I will see her back in 4 months for clinical exam.        Maudry Diego, MD Surgical Oncology, General & Endocrine Surgery [facility]Wellington Hampshire, MD No ref. provider found

## 2012-10-25 NOTE — Patient Instructions (Signed)
Follow up with me in 4 months.

## 2012-10-26 ENCOUNTER — Ambulatory Visit: Payer: Medicare Other

## 2012-10-26 ENCOUNTER — Ambulatory Visit: Payer: Medicare Other | Admitting: Radiation Oncology

## 2012-10-27 ENCOUNTER — Ambulatory Visit: Payer: Medicare Other | Admitting: Oncology

## 2012-10-28 ENCOUNTER — Encounter: Payer: Medicare Other | Admitting: Genetic Counselor

## 2012-10-28 ENCOUNTER — Other Ambulatory Visit: Payer: Medicare Other | Admitting: Lab

## 2012-11-06 ENCOUNTER — Encounter: Payer: Self-pay | Admitting: *Deleted

## 2012-11-06 NOTE — Progress Notes (Signed)
Mailed after appt letter to pt. 

## 2012-12-06 ENCOUNTER — Encounter: Payer: Self-pay | Admitting: Family Medicine

## 2012-12-06 ENCOUNTER — Ambulatory Visit (INDEPENDENT_AMBULATORY_CARE_PROVIDER_SITE_OTHER): Payer: Medicare Other | Admitting: Family Medicine

## 2012-12-06 VITALS — BP 122/76 | HR 70 | Ht 66.0 in | Wt 190.0 lb

## 2012-12-06 DIAGNOSIS — Z23 Encounter for immunization: Secondary | ICD-10-CM

## 2012-12-06 DIAGNOSIS — M1990 Unspecified osteoarthritis, unspecified site: Secondary | ICD-10-CM

## 2012-12-06 DIAGNOSIS — M899 Disorder of bone, unspecified: Secondary | ICD-10-CM

## 2012-12-06 DIAGNOSIS — E785 Hyperlipidemia, unspecified: Secondary | ICD-10-CM

## 2012-12-06 DIAGNOSIS — I10 Essential (primary) hypertension: Secondary | ICD-10-CM

## 2012-12-06 DIAGNOSIS — M8580 Other specified disorders of bone density and structure, unspecified site: Secondary | ICD-10-CM

## 2012-12-06 DIAGNOSIS — Z8249 Family history of ischemic heart disease and other diseases of the circulatory system: Secondary | ICD-10-CM

## 2012-12-06 DIAGNOSIS — Z0000 Encounter for general adult medical examination without abnormal findings: Secondary | ICD-10-CM

## 2012-12-06 DIAGNOSIS — E669 Obesity, unspecified: Secondary | ICD-10-CM

## 2012-12-06 DIAGNOSIS — C50412 Malignant neoplasm of upper-outer quadrant of left female breast: Secondary | ICD-10-CM

## 2012-12-06 DIAGNOSIS — M129 Arthropathy, unspecified: Secondary | ICD-10-CM

## 2012-12-06 DIAGNOSIS — C50419 Malignant neoplasm of upper-outer quadrant of unspecified female breast: Secondary | ICD-10-CM

## 2012-12-06 LAB — LIPID PANEL
Cholesterol: 142 mg/dL (ref 0–200)
HDL: 51 mg/dL (ref >39)
Total CHOL/HDL Ratio: 2.8 Ratio
Triglycerides: 90 mg/dL (ref <150)
VLDL: 18 mg/dL (ref 0–40)

## 2012-12-06 LAB — CBC WITH DIFFERENTIAL/PLATELET
Eosinophils Absolute: 0.3 10*3/uL (ref 0.0–0.7)
Eosinophils Relative: 4 % (ref 0–5)
HCT: 42.5 % (ref 36.0–46.0)
Lymphs Abs: 2 10*3/uL (ref 0.7–4.0)
MCH: 26.4 pg (ref 26.0–34.0)
MCV: 80.2 fL (ref 78.0–100.0)
Monocytes Absolute: 0.5 10*3/uL (ref 0.1–1.0)
Platelets: 222 10*3/uL (ref 150–400)
RBC: 5.3 MIL/uL — ABNORMAL HIGH (ref 3.87–5.11)
RDW: 14.1 % (ref 11.5–15.5)

## 2012-12-06 LAB — COMPREHENSIVE METABOLIC PANEL
ALT: 17 U/L (ref 0–35)
BUN: 14 mg/dL (ref 6–23)
CO2: 29 mEq/L (ref 19–32)
Calcium: 9.6 mg/dL (ref 8.4–10.5)
Creat: 0.79 mg/dL (ref 0.50–1.10)
Total Bilirubin: 0.8 mg/dL (ref 0.3–1.2)

## 2012-12-06 MED ORDER — AMLODIPINE BESYLATE 5 MG PO TABS: 5.0000 mg | ORAL_TABLET | Freq: Every day | ORAL | Status: DC

## 2012-12-06 MED ORDER — BISOPROLOL-HYDROCHLOROTHIAZIDE 10-6.25 MG PO TABS: 1.0000 | ORAL_TABLET | Freq: Every day | ORAL | Status: DC

## 2012-12-06 MED ORDER — SIMVASTATIN 20 MG PO TABS: 20.0000 mg | ORAL_TABLET | Freq: Every day | ORAL | Status: DC

## 2012-12-06 NOTE — Progress Notes (Signed)
  Subjective:    Patient ID: Alexandra Bowen, female    DOB: 04-28-1936, 77 y.o.   MRN: [id number]  HPI She is here for complete examination. She has been dealing recently with breast cancer and recently finished radiation. She is not planning on having any followup adjuvant care. She has seen her oncologist concerning this. He continues on her blood pressure medications as well as simvastatin and is having no difficulty with this. She does have some minor difficulty with arthritis but otherwise seems to be doing well. She has no other questions or concerns. Social and family history were reviewed.   She does have a history of osteopenia.   Review of Systems Negative except as above    Objective:   Physical Exam BP 122/76  Pulse 70  Ht 5\' 6"  (1.676 m)  Wt 190 lb (86.183 kg)  BMI 30.68 kg/m2  General Appearance:    Alert, cooperative, no distress, appears stated age  Head:    Normocephalic, without obvious abnormality, atraumatic  Eyes:    PERRL, conjunctiva/corneas clear, EOM's intact, fundi    benign  Ears:    Normal TM's and external ear canals  Nose:   Nares normal, mucosa normal, no drainage or sinus   tenderness  Throat:   Lips, mucosa, and tongue normal; teeth and gums normal  Neck:   Supple, no lymphadenopathy;  thyroid:  no   enlargement/tenderness/nodules; no carotid   bruit or JVD  Back:    Spine nontender, no curvature, ROM normal, no CVA     tenderness  Lungs:     Clear to auscultation bilaterally without wheezes, rales or     ronchi; respirations unlabored  Chest Wall:    No tenderness or deformity   Heart:    Regular rate and rhythm, S1 and S2 normal, no murmur, rub   or gallop  Breast Exam:    Deferred to GYN  Abdomen:     Soft, non-tender, nondistended, normoactive bowel sounds,    no masses, no hepatosplenomegaly  Genitalia:    Deferred to GYN     Extremities:   No clubbing, cyanosis or edema  Pulses:   2+ and symmetric all extremities  Skin:   Skin color,  texture, turgor normal, no rashes or lesions  Lymph nodes:   Cervical, supraclavicular, and axillary nodes normal  Neurologic:   CNII-XII intact, normal strength, sensation and gait; reflexes 2+ and symmetric throughout          Psych:   Normal mood, affect, hygiene and grooming.      Assessment & Plan:  Routine general medical examination at a health care facility - Plan: Pneumococcal polysaccharide vaccine 23-valent greater than or equal to 2yo subcutaneous/IM, CBC with Differential, Comprehensive metabolic panel, Lipid panel  Arthritis  Cancer of upper-outer quadrant of female breast, left - Plan: CBC with Differential, Comprehensive metabolic panel, Vitamin D 25 hydroxy  Family history of heart disease in female family member before age 64 - Plan: Lipid panel  Hyperlipidemia LDL goal <100 - Plan: simvastatin (ZOCOR) 20 MG tablet, Lipid panel  Hypertension - Plan: bisoprolol-hydrochlorothiazide (ZIAC) 10-6.25 MG per tablet, amLODipine (NORVASC) 5 MG tablet, CBC with Differential, Comprehensive metabolic panel  Obesity (BMI 30-39.9)  Osteopenia - Plan: CBC with Differential, Comprehensive metabolic panel, Vitamin D 25 hydroxy  colonoscopy was discussed and at this point she is not interested in repeat. She was given a repeat Pneumovax.

## 2013-01-17 ENCOUNTER — Other Ambulatory Visit: Payer: Self-pay | Admitting: Dermatology

## 2013-02-03 ENCOUNTER — Other Ambulatory Visit (INDEPENDENT_AMBULATORY_CARE_PROVIDER_SITE_OTHER): Payer: Medicare Other

## 2013-02-03 DIAGNOSIS — Z23 Encounter for immunization: Secondary | ICD-10-CM

## 2013-02-28 ENCOUNTER — Other Ambulatory Visit (INDEPENDENT_AMBULATORY_CARE_PROVIDER_SITE_OTHER): Payer: Self-pay | Admitting: General Surgery

## 2013-02-28 ENCOUNTER — Encounter (INDEPENDENT_AMBULATORY_CARE_PROVIDER_SITE_OTHER): Payer: Self-pay

## 2013-02-28 ENCOUNTER — Ambulatory Visit (INDEPENDENT_AMBULATORY_CARE_PROVIDER_SITE_OTHER): Payer: Medicare Other | Admitting: General Surgery

## 2013-02-28 ENCOUNTER — Encounter (INDEPENDENT_AMBULATORY_CARE_PROVIDER_SITE_OTHER): Payer: Self-pay | Admitting: General Surgery

## 2013-02-28 VITALS — BP 142/86 | HR 72 | Temp 97.4°F | Resp 16 | Ht 66.0 in | Wt 197.2 lb

## 2013-02-28 DIAGNOSIS — C50419 Malignant neoplasm of upper-outer quadrant of unspecified female breast: Secondary | ICD-10-CM

## 2013-02-28 DIAGNOSIS — C50412 Malignant neoplasm of upper-outer quadrant of left female breast: Secondary | ICD-10-CM

## 2013-02-28 DIAGNOSIS — C50912 Malignant neoplasm of unspecified site of left female breast: Secondary | ICD-10-CM

## 2013-02-28 NOTE — Patient Instructions (Addendum)
Follow up in 4 months with me.    I did talk to the radiologist about the MRI and they DO recommend getting one because of the other biopsy that you had.    You should schedule that before the end of the year if possible.

## 2013-03-01 NOTE — Assessment & Plan Note (Signed)
No evidence of late surgical complications.  She did not require radiation.  She declined adjuvant chemoprevention.  She is not interested in follow up.    She does need follow up MRI for lesion in right breast.  This was biopsied and was benign.  However, the radiologists would like to make sure this area has not changed.  This is due in late November/early December.    I will see her back in 4 months.

## 2013-03-01 NOTE — Progress Notes (Signed)
HISTORY: Pt is a 77 yo F who is around 5 months s/p BCT for DCIS with negative margins.  She did well post op.  She has not wanted to pursue adjuvant therapy, despite attempts to get her to take at least an antihormonal treatment.  She has good energy level.  She denies any pain in her breast.  She has not felt any other masses or lesions.  She is wondering about her breast imaging, and what the timing should be.  She has no new health problems.     PERTINENT REVIEW OF SYSTEMS: Otherwise negative times 3.  Filed Vitals:   02/28/13 1109  BP: 142/86  Pulse: 72  Temp: 97.4 F (36.3 C)  Resp: 16   Filed Weights   02/28/13 1109  Weight: 197 lb 3.2 oz (89.449 kg)     EXAM: Head: Normocephalic and atraumatic.  Eyes:  Conjunctivae are normal. Pupils are equal, round, and reactive to light. No scleral icterus.  Neck:  Normal range of motion. Neck supple. No tracheal deviation present. No thyromegaly present. No cervical, pre or post auricular, no supra or infraclavicular adenopathy.   Resp: No respiratory distress, normal effort. CV:  RR&R Breast:  No palpable masses.  No nipple discharge.  Minimal contour change.  No LAD.  No skin dimpling.  Breasts are reasonably dense bilaterally.   Abd:  Abdomen is soft, non distended and non tender. No masses are palpable.  There is no rebound and no guarding.  Neurological: Alert and oriented to person, place, and time. Coordination normal.  Skin: Skin is warm and dry. No rash noted. No diaphoretic. No erythema. No pallor.  Psychiatric: Normal mood and affect. Normal behavior. Judgment and thought content normal.      ASSESSMENT AND PLAN:   Cancer of upper-outer quadrant of female breast, L DCIS No evidence of late surgical complications.  She did not require radiation.  She declined adjuvant chemoprevention.  She is not interested in follow up.    She does need follow up MRI for lesion in right breast.  This was biopsied and was benign.   However, the radiologists would like to make sure this area has not changed.  This is due in late November/early December.    I will see her back in 4 months.        Maudry Diego, MD Surgical Oncology, General & Endocrine Surgery [facility]Wellington Hampshire, MD Ronnald Nian, MD

## 2013-03-11 ENCOUNTER — Ambulatory Visit
Admission: RE | Admit: 2013-03-11 | Discharge: 2013-03-11 | Disposition: A | Discharge status: Home / Self Care | Payer: Medicare Other | Source: Ambulatory Visit | Attending: General Surgery | Admitting: General Surgery

## 2013-03-11 ENCOUNTER — Other Ambulatory Visit: Payer: Self-pay | Admitting: Internal Medicine

## 2013-03-11 DIAGNOSIS — C50912 Malignant neoplasm of unspecified site of left female breast: Secondary | ICD-10-CM

## 2013-03-11 MED ORDER — GADOBENATE DIMEGLUMINE 529 MG/ML IV SOLN
18.0000 mL | Freq: Once | INTRAVENOUS | Status: AC | PRN
  Administered 2013-03-11: 18 mL via INTRAVENOUS

## 2013-03-22 ENCOUNTER — Telehealth (INDEPENDENT_AMBULATORY_CARE_PROVIDER_SITE_OTHER): Payer: Self-pay

## 2013-03-22 NOTE — Telephone Encounter (Signed)
Pt calling if for results of breast MRI done 11/14.  Results given.

## 2013-05-25 ENCOUNTER — Telehealth: Payer: Self-pay | Admitting: Family Medicine

## 2013-05-26 NOTE — Telephone Encounter (Signed)
Is this okay?

## 2013-05-26 NOTE — Telephone Encounter (Signed)
Go ahead and set this up

## 2013-05-26 NOTE — Telephone Encounter (Signed)
Faxed order to Dr.Byerly

## 2013-05-31 ENCOUNTER — Telehealth: Payer: Self-pay | Admitting: Family Medicine

## 2013-05-31 NOTE — Telephone Encounter (Signed)
Pt has follow up with Dr, Barry Dienes in February and need [facility] referral//authorization to Dr. Barry Dienes   Please call patient and let her know status

## 2013-05-31 NOTE — Telephone Encounter (Signed)
Faxed to [employer]

## 2013-06-14 ENCOUNTER — Ambulatory Visit (INDEPENDENT_AMBULATORY_CARE_PROVIDER_SITE_OTHER): Payer: Medicare Other | Admitting: General Surgery

## 2013-07-04 ENCOUNTER — Ambulatory Visit (INDEPENDENT_AMBULATORY_CARE_PROVIDER_SITE_OTHER): Payer: Commercial Managed Care - HMO | Admitting: General Surgery

## 2013-07-04 ENCOUNTER — Encounter (INDEPENDENT_AMBULATORY_CARE_PROVIDER_SITE_OTHER): Payer: Self-pay | Admitting: General Surgery

## 2013-07-04 VITALS — BP 124/80 | HR 78 | Temp 98.0°F | Resp 16 | Ht 66.0 in | Wt 201.0 lb

## 2013-07-04 DIAGNOSIS — C50419 Malignant neoplasm of upper-outer quadrant of unspecified female breast: Secondary | ICD-10-CM

## 2013-07-04 NOTE — Assessment & Plan Note (Signed)
No clinical evidence of disease.  Mammo (dx bilateral) due April 2015.  Follow up in 6 months.

## 2013-07-04 NOTE — Progress Notes (Signed)
HISTORY: Pt is a 78 yo F who is around 5 months s/p BCT for DCIS with negative margins.  She is doing well.  She declined AI and did not get XRT.  She denies palpating any new masses.  She does have some size discrepancy of her breasts, but states that this does not bother her with her bras or clothes.  She is due for mammogram soon.       PERTINENT REVIEW OF SYSTEMS: Otherwise negative times 11  Filed Vitals:   07/04/13 1538  BP: 124/80  Pulse: 78  Temp: 98 F (36.7 C)  Resp: 16   Filed Weights   07/04/13 1538  Weight: 201 lb (91.173 kg)     EXAM: Head: Normocephalic and atraumatic.  Eyes:  Conjunctivae are normal. Pupils are equal, round, and reactive to light. No scleral icterus.  Neck:  Normal range of motion. Neck supple. No tracheal deviation present. No thyromegaly present. No cervical, pre or post auricular, no supra or infraclavicular adenopathy.   Resp: No respiratory distress, normal effort. CV:  RR&R Breast:  No palpable masses.  No nipple discharge.  Minimal contour change.  No LAD.  No skin dimpling.  Breasts are reasonably dense bilaterally.   Abd:  Abdomen is soft, non distended and non tender. No masses are palpable.  There is no rebound and no guarding.  Neurological: Alert and oriented to person, place, and time. Coordination normal.  Skin: Skin is warm and dry. No rash noted. No diaphoretic. No erythema. No pallor.  Psychiatric: Normal mood and affect. Normal behavior. Judgment and thought content normal.      ASSESSMENT AND PLAN:   Cancer of upper-outer quadrant of female breast, L DCIS No clinical evidence of disease.  Mammo (dx bilateral) due April 2015.  Follow up in 6 months.         Milus Height, MD Surgical Oncology, General & Endocrine Surgery [facility]Kerby Moors, MD Denita Lung, MD

## 2013-07-04 NOTE — Patient Instructions (Signed)
Follow up in 6 months.  Get mammogram in April 2015.

## 2013-07-14 ENCOUNTER — Encounter: Payer: Self-pay | Admitting: Family Medicine

## 2013-07-14 ENCOUNTER — Ambulatory Visit (INDEPENDENT_AMBULATORY_CARE_PROVIDER_SITE_OTHER): Payer: Commercial Managed Care - HMO | Admitting: Family Medicine

## 2013-07-14 ENCOUNTER — Other Ambulatory Visit: Payer: Self-pay | Admitting: Family Medicine

## 2013-07-14 VITALS — BP 130/70 | HR 60 | Wt 199.0 lb

## 2013-07-14 DIAGNOSIS — M129 Arthropathy, unspecified: Secondary | ICD-10-CM

## 2013-07-14 DIAGNOSIS — Z9889 Other specified postprocedural states: Secondary | ICD-10-CM

## 2013-07-14 DIAGNOSIS — M1990 Unspecified osteoarthritis, unspecified site: Secondary | ICD-10-CM

## 2013-07-14 DIAGNOSIS — Z853 Personal history of malignant neoplasm of breast: Secondary | ICD-10-CM

## 2013-07-14 NOTE — Patient Instructions (Signed)
Take 2 Tylenol 4 times a day as needed for the pain. If you need to you can add Advil to this. If it continues, come back and we can get x-rays

## 2013-07-14 NOTE — Progress Notes (Signed)
   Subjective:    Patient ID: Alexandra Bowen, female    DOB: Jun 28, 1935, 78 y.o.   MRN: [id number]  HPI She has a three-week history of bilateral knee pain that is worse with physical activity. She notes some swelling in her knee area that she has concerns over. She has occasionally been taking Advil for this as well as elevating.   Review of Systems     Objective:   Physical Exam Alert and in no distress. Exam of both knees does show fatty deposits near the knee but not actually in the joint. There is no evidence of effusion. Full motion of the knees. No joint line tenderness.       Assessment & Plan:  Arthritis  I explained in detail that the swelling she is feeling is actually fatty deposits and not truly in the joint. Recommend initially using Tylenol and then adding Advil on an as-needed basis and if continued difficulty, return here for x-rays.

## 2013-08-19 ENCOUNTER — Encounter (INDEPENDENT_AMBULATORY_CARE_PROVIDER_SITE_OTHER): Payer: Self-pay

## 2013-08-19 ENCOUNTER — Ambulatory Visit
Admission: RE | Admit: 2013-08-19 | Discharge: 2013-08-19 | Disposition: A | Discharge status: Home / Self Care | Payer: Commercial Managed Care - HMO | Source: Ambulatory Visit | Attending: Family Medicine | Admitting: Family Medicine

## 2013-08-19 DIAGNOSIS — Z9889 Other specified postprocedural states: Secondary | ICD-10-CM

## 2013-08-19 DIAGNOSIS — Z853 Personal history of malignant neoplasm of breast: Secondary | ICD-10-CM

## 2013-10-03 ENCOUNTER — Telehealth: Payer: Self-pay | Admitting: Family Medicine

## 2013-10-03 NOTE — Telephone Encounter (Signed)
Check to make sure it's about her knees and if so go ahead and refer her

## 2013-10-03 NOTE — Telephone Encounter (Signed)
Pt came in and requested a referral to Dr. Jenetta Downer. She states she would like to see him for on going issues. I asked if she had see you for these issues and she was unclear. Pt was informed that she may need an appt but wanted note back anyway. Please refer pt to ortho re. Dr. Jenetta Downer. Pt can be reached at [phone].

## 2013-10-03 NOTE — Telephone Encounter (Signed)
Called and left message on PT vm for verification

## 2013-10-03 NOTE — Telephone Encounter (Signed)
refferall sent to [employer] for Authorization, pt already has an appt scheduled with Dr. Emiliano Dyer

## 2013-10-03 NOTE — Telephone Encounter (Signed)
Pt states she is needing to be seen moreso for hammertoe and bunion and knee. Is this ok?

## 2013-12-07 ENCOUNTER — Encounter: Payer: Self-pay | Admitting: Family Medicine

## 2013-12-07 ENCOUNTER — Ambulatory Visit (INDEPENDENT_AMBULATORY_CARE_PROVIDER_SITE_OTHER): Payer: Commercial Managed Care - HMO | Admitting: Family Medicine

## 2013-12-07 VITALS — BP 122/78 | HR 66 | Ht 65.0 in | Wt 194.0 lb

## 2013-12-07 DIAGNOSIS — I10 Essential (primary) hypertension: Secondary | ICD-10-CM

## 2013-12-07 DIAGNOSIS — M129 Arthropathy, unspecified: Secondary | ICD-10-CM

## 2013-12-07 DIAGNOSIS — M899 Disorder of bone, unspecified: Secondary | ICD-10-CM

## 2013-12-07 DIAGNOSIS — E669 Obesity, unspecified: Secondary | ICD-10-CM

## 2013-12-07 DIAGNOSIS — C50419 Malignant neoplasm of upper-outer quadrant of unspecified female breast: Secondary | ICD-10-CM

## 2013-12-07 DIAGNOSIS — Z8249 Family history of ischemic heart disease and other diseases of the circulatory system: Secondary | ICD-10-CM

## 2013-12-07 DIAGNOSIS — E785 Hyperlipidemia, unspecified: Secondary | ICD-10-CM

## 2013-12-07 DIAGNOSIS — M1990 Unspecified osteoarthritis, unspecified site: Secondary | ICD-10-CM

## 2013-12-07 DIAGNOSIS — Z0000 Encounter for general adult medical examination without abnormal findings: Secondary | ICD-10-CM

## 2013-12-07 DIAGNOSIS — M8580 Other specified disorders of bone density and structure, unspecified site: Secondary | ICD-10-CM

## 2013-12-07 DIAGNOSIS — M949 Disorder of cartilage, unspecified: Secondary | ICD-10-CM

## 2013-12-07 DIAGNOSIS — C50412 Malignant neoplasm of upper-outer quadrant of left female breast: Secondary | ICD-10-CM

## 2013-12-07 LAB — CBC WITH DIFFERENTIAL/PLATELET
BASOS PCT: 0 % (ref 0–1)
Basophils Absolute: 0 10*3/uL (ref 0.0–0.1)
Eosinophils Absolute: 0.2 10*3/uL (ref 0.0–0.7)
Eosinophils Relative: 2 % (ref 0–5)
HCT: 43 % (ref 36.0–46.0)
HEMOGLOBIN: 14.6 g/dL (ref 12.0–15.0)
LYMPHS ABS: 2 10*3/uL (ref 0.7–4.0)
LYMPHS PCT: 21 % (ref 12–46)
MCH: 26.9 pg (ref 26.0–34.0)
MCHC: 34 g/dL (ref 30.0–36.0)
MCV: 79.3 fL (ref 78.0–100.0)
MONOS PCT: 5 % (ref 3–12)
Monocytes Absolute: 0.5 10*3/uL (ref 0.1–1.0)
NEUTROS ABS: 6.7 10*3/uL (ref 1.7–7.7)
NEUTROS PCT: 72 % (ref 43–77)
Platelets: 222 10*3/uL (ref 150–400)
RBC: 5.42 MIL/uL — AB (ref 3.87–5.11)
RDW: 14.3 % (ref 11.5–15.5)
WBC: 9.3 10*3/uL (ref 4.0–10.5)

## 2013-12-07 LAB — COMPREHENSIVE METABOLIC PANEL
ALBUMIN: 4.3 g/dL (ref 3.5–5.2)
ALK PHOS: 102 U/L (ref 39–117)
ALT: 16 U/L (ref 0–35)
AST: 19 U/L (ref 0–37)
BUN: 15 mg/dL (ref 6–23)
CO2: 26 meq/L (ref 19–32)
Calcium: 9.1 mg/dL (ref 8.4–10.5)
Chloride: 104 mEq/L (ref 96–112)
Creat: 0.9 mg/dL (ref 0.50–1.10)
GLUCOSE: 110 mg/dL — AB (ref 70–99)
POTASSIUM: 3.9 meq/L (ref 3.5–5.3)
SODIUM: 144 meq/L (ref 135–145)
TOTAL PROTEIN: 6.8 g/dL (ref 6.0–8.3)
Total Bilirubin: 0.8 mg/dL (ref 0.2–1.2)

## 2013-12-07 LAB — LIPID PANEL
Cholesterol: 141 mg/dL (ref 0–200)
HDL: 56 mg/dL (ref >39)
LDL CALC: 70 mg/dL (ref 0–99)
TRIGLYCERIDES: 73 mg/dL (ref <150)
Total CHOL/HDL Ratio: 2.5 Ratio
VLDL: 15 mg/dL (ref 0–40)

## 2013-12-07 MED ORDER — BISOPROLOL-HYDROCHLOROTHIAZIDE 10-6.25 MG PO TABS: 1.0000 | ORAL_TABLET | Freq: Every day | ORAL | Status: DC

## 2013-12-07 MED ORDER — SIMVASTATIN 20 MG PO TABS: 20.0000 mg | ORAL_TABLET | Freq: Every day | ORAL | Status: DC

## 2013-12-07 MED ORDER — AMLODIPINE BESYLATE 5 MG PO TABS: 5.0000 mg | ORAL_TABLET | Freq: Every day | ORAL | Status: DC

## 2013-12-07 NOTE — Progress Notes (Signed)
   Subjective:    Patient ID: Alexandra Bowen, female    DOB: 1936/01/10, 78 y.o.   MRN: [id number]  HPI She is here for complete examination. She in general is doing quite well. She does have a history of breast cancer and did have a lumpectomy. She is likely to not have any chemotherapy or radiation and is being followed by oncology for this. She continues on her blood pressure medication as well as Zocor and is having no difficulty with that. She does have arthritis and sees her orthopedic surgeon on an as-needed basis. She did have an injection which did help. Her last DEXA scan was 2012 however she does not want another one. She has had a mammogram this year. Diet and exercise was reviewed. She has no other concerns or complaints. Family and social history was also reviewed.   Review of Systems  All other systems reviewed and are negative.      Objective:   Physical Exam BP 122/78  Pulse 66  Ht 5\' 5"  (1.651 m)  Wt 194 lb (87.998 kg)  BMI 32.28 kg/m2  General Appearance:    Alert, cooperative, no distress, appears stated age  Head:    Normocephalic, without obvious abnormality, atraumatic  Eyes:    PERRL, conjunctiva/corneas clear, EOM's intact, fundi    benign  Ears:    Normal TM's and external ear canals  Nose:   Nares normal, mucosa normal, no drainage or sinus   tenderness  Throat:   Lips, mucosa, and tongue normal; teeth and gums normal  Neck:   Supple, no lymphadenopathy;  thyroid: no enlargement/tenderness/nodules; no carotid   bruit or JVD  Back:    Spine nontender, no curvature, ROM normal, no CVA     tenderness  Lungs:     Clear to auscultation bilaterally without wheezes, rales or     ronchi; respirations unlabored  Chest Wall:    No tenderness or deformity   Heart:    Regular rate and rhythm, S1 and S2 normal, no murmur, rub   or gallop  Breast Exam:    Deferred to GYN  Abdomen:     Soft, non-tender, nondistended, normoactive bowel sounds,    no masses, no  hepatosplenomegaly  Genitalia:    Deferred to GYN     Extremities:   No clubbing, cyanosis or edema  Pulses:   2+ and symmetric all extremities  Skin:   Skin color, texture, turgor normal, no rashes or lesions  Lymph nodes:   Cervical, supraclavicular, and axillary nodes normal  Neurologic:   CNII-XII intact, normal strength, sensation and gait; reflexes 2+ and symmetric throughout          Psych:   Normal mood, affect, hygiene and grooming.         Assessment & Plan:  Osteopenia - Plan: CANCELED: DG Bone Density  Obesity (BMI 30-39.9) - Plan: CBC with Differential, Comprehensive metabolic panel, Lipid panel  Essential hypertension - Plan: bisoprolol-hydrochlorothiazide (ZIAC) 10-6.25 MG per tablet, amLODipine (NORVASC) 5 MG tablet, CBC with Differential, Comprehensive metabolic panel  Hyperlipidemia LDL goal <100 - Plan: simvastatin (ZOCOR) 20 MG tablet, Lipid panel  Family history of heart disease in female family member before age 44 - Plan: CBC with Differential, Comprehensive metabolic panel, Lipid panel  Cancer of upper-outer quadrant of female breast, left  Arthritis  in general she seems to be doing quite nicely.

## 2013-12-30 ENCOUNTER — Telehealth: Payer: Self-pay | Admitting: Family Medicine

## 2013-12-30 NOTE — Telephone Encounter (Signed)
Received [employer]/ [employer] authorization, faxed copy to [facility]  Attention: Caryl Pina  [phone]

## 2013-12-30 NOTE — Telephone Encounter (Signed)
Received call from [place] at [facility] requesting referral for upcoming follow up appointment with Dr. Barry Dienes  Completed referral and faxed to [facility]

## 2014-01-03 ENCOUNTER — Ambulatory Visit (INDEPENDENT_AMBULATORY_CARE_PROVIDER_SITE_OTHER): Payer: Commercial Managed Care - HMO | Admitting: General Surgery

## 2014-01-26 ENCOUNTER — Telehealth: Payer: Self-pay | Admitting: Internal Medicine

## 2014-01-26 NOTE — Telephone Encounter (Signed)
Done

## 2014-01-26 NOTE — Telephone Encounter (Signed)
Pt needs a referral done through [facility] as pt has an upcoming appt to see Dr. Wylene Simmer for post-op hammer toe.  [facility] Dr. Wylene Simmer NPI- [id number] ICD 10 code- M20.4 Fax # [phone] Phone # [phone] Date of Service is Monday 01/30/14

## 2014-02-06 ENCOUNTER — Other Ambulatory Visit (INDEPENDENT_AMBULATORY_CARE_PROVIDER_SITE_OTHER): Payer: Commercial Managed Care - HMO

## 2014-02-06 DIAGNOSIS — Z23 Encounter for immunization: Secondary | ICD-10-CM

## 2014-05-17 ENCOUNTER — Telehealth: Payer: Self-pay | Admitting: Family Medicine

## 2014-05-17 NOTE — Telephone Encounter (Signed)
Requesting referral to Dr Alvan Dame for her appt with him next week for her knee. Pt says after Dr Doran Durand saw her for her foot surgery, he suggested that she see Dr Alvan Dame who specializes in joint problems so pt made that appt but referral need to come from Dr Redmond School

## 2014-05-18 NOTE — Telephone Encounter (Signed)
Pt called and states she already has an appt with Dr. Alvan Dame on Wednesday at 10am but due to her insurance she just needed a referral. Pt is having pressure on knee when walking and she just needs some relief. Pt has an appt here with you to see if you can help her any by giving and injection or something and then she wants you to tell her whether or not she needs to keep the appt for Dr. Alvan Dame. She has had this appt scheduled for 3 months and does not want to cancel it until she knows for sure she doesn't need to go see him.

## 2014-05-18 NOTE — Telephone Encounter (Signed)
I can definitely see her for this and given injection and save her the trip to Dr. Alvan Dame

## 2014-05-22 ENCOUNTER — Telehealth: Payer: Self-pay | Admitting: Family Medicine

## 2014-05-22 NOTE — Telephone Encounter (Signed)
Isn't she is scheduled to see me for this

## 2014-05-22 NOTE — Telephone Encounter (Signed)
Pt has an appointment already

## 2014-05-22 NOTE — Telephone Encounter (Signed)
Caren Griffins with [facility]  [phone] called Ms. Hagstrom has an appointment on Wednesday morning, 05/24/14 with Dr. Paralee Cancel for R knee pain They need [facility] referral

## 2014-05-22 NOTE — Telephone Encounter (Signed)
This has already been done pt said there is more to the knee problem now due to surgery

## 2014-05-23 ENCOUNTER — Ambulatory Visit: Payer: Commercial Managed Care - HMO | Admitting: Family Medicine

## 2014-05-24 DIAGNOSIS — M25561 Pain in right knee: Secondary | ICD-10-CM | POA: Diagnosis not present

## 2014-05-24 DIAGNOSIS — M1711 Unilateral primary osteoarthritis, right knee: Secondary | ICD-10-CM | POA: Diagnosis not present

## 2014-06-15 ENCOUNTER — Telehealth: Payer: Self-pay | Admitting: Family Medicine

## 2014-06-15 NOTE — Telephone Encounter (Signed)
Requesting referral to Dr Barry Dienes for her FU appt in March for breast cancer. Fax to [phone]

## 2014-06-15 NOTE — Telephone Encounter (Signed)
This was approved until august through acuity connect. Once fax comes over from [employer] i will fax over form with approval

## 2014-07-04 ENCOUNTER — Other Ambulatory Visit: Payer: Self-pay | Admitting: Family Medicine

## 2014-07-04 DIAGNOSIS — Z853 Personal history of malignant neoplasm of breast: Secondary | ICD-10-CM

## 2014-07-04 DIAGNOSIS — Z9889 Other specified postprocedural states: Secondary | ICD-10-CM

## 2014-07-07 DIAGNOSIS — C50412 Malignant neoplasm of upper-outer quadrant of left female breast: Secondary | ICD-10-CM | POA: Diagnosis not present

## 2014-08-18 ENCOUNTER — Other Ambulatory Visit: Payer: Self-pay

## 2014-08-18 ENCOUNTER — Other Ambulatory Visit: Payer: Self-pay | Admitting: Family Medicine

## 2014-08-18 DIAGNOSIS — Z853 Personal history of malignant neoplasm of breast: Secondary | ICD-10-CM

## 2014-08-18 DIAGNOSIS — Z9889 Other specified postprocedural states: Secondary | ICD-10-CM

## 2014-08-21 ENCOUNTER — Ambulatory Visit
Admission: RE | Admit: 2014-08-21 | Discharge: 2014-08-21 | Disposition: A | Discharge status: Home / Self Care | Payer: Commercial Managed Care - HMO | Source: Ambulatory Visit | Attending: Family Medicine | Admitting: Family Medicine

## 2014-08-21 DIAGNOSIS — Z853 Personal history of malignant neoplasm of breast: Secondary | ICD-10-CM

## 2014-08-21 DIAGNOSIS — Z9889 Other specified postprocedural states: Secondary | ICD-10-CM

## 2014-08-21 DIAGNOSIS — R928 Other abnormal and inconclusive findings on diagnostic imaging of breast: Secondary | ICD-10-CM | POA: Diagnosis not present

## 2014-08-23 DIAGNOSIS — M1711 Unilateral primary osteoarthritis, right knee: Secondary | ICD-10-CM | POA: Diagnosis not present

## 2014-08-23 DIAGNOSIS — M25561 Pain in right knee: Secondary | ICD-10-CM | POA: Diagnosis not present

## 2014-09-19 DIAGNOSIS — H5210 Myopia, unspecified eye: Secondary | ICD-10-CM | POA: Diagnosis not present

## 2014-09-19 DIAGNOSIS — H5213 Myopia, bilateral: Secondary | ICD-10-CM | POA: Diagnosis not present

## 2014-11-22 DIAGNOSIS — M25561 Pain in right knee: Secondary | ICD-10-CM | POA: Diagnosis not present

## 2014-11-22 DIAGNOSIS — M1711 Unilateral primary osteoarthritis, right knee: Secondary | ICD-10-CM | POA: Diagnosis not present

## 2014-12-11 ENCOUNTER — Ambulatory Visit (INDEPENDENT_AMBULATORY_CARE_PROVIDER_SITE_OTHER): Payer: Commercial Managed Care - HMO | Admitting: Family Medicine

## 2014-12-11 ENCOUNTER — Encounter: Payer: Self-pay | Admitting: Family Medicine

## 2014-12-11 VITALS — BP 124/70 | HR 60

## 2014-12-11 DIAGNOSIS — E669 Obesity, unspecified: Secondary | ICD-10-CM | POA: Diagnosis not present

## 2014-12-11 DIAGNOSIS — M8580 Other specified disorders of bone density and structure, unspecified site: Secondary | ICD-10-CM | POA: Diagnosis not present

## 2014-12-11 DIAGNOSIS — Z8249 Family history of ischemic heart disease and other diseases of the circulatory system: Secondary | ICD-10-CM

## 2014-12-11 DIAGNOSIS — E785 Hyperlipidemia, unspecified: Secondary | ICD-10-CM

## 2014-12-11 DIAGNOSIS — C50411 Malignant neoplasm of upper-outer quadrant of right female breast: Secondary | ICD-10-CM | POA: Diagnosis not present

## 2014-12-11 DIAGNOSIS — M1990 Unspecified osteoarthritis, unspecified site: Secondary | ICD-10-CM

## 2014-12-11 DIAGNOSIS — I10 Essential (primary) hypertension: Secondary | ICD-10-CM | POA: Diagnosis not present

## 2014-12-11 DIAGNOSIS — C50419 Malignant neoplasm of upper-outer quadrant of unspecified female breast: Secondary | ICD-10-CM | POA: Diagnosis not present

## 2014-12-11 LAB — CBC WITH DIFFERENTIAL/PLATELET
Basophils Absolute: 0 10*3/uL (ref 0.0–0.1)
Basophils Relative: 0 % (ref 0–1)
EOS PCT: 4 % (ref 0–5)
Eosinophils Absolute: 0.4 10*3/uL (ref 0.0–0.7)
HEMATOCRIT: 42.4 % (ref 36.0–46.0)
HEMOGLOBIN: 14 g/dL (ref 12.0–15.0)
LYMPHS ABS: 2.1 10*3/uL (ref 0.7–4.0)
LYMPHS PCT: 19 % (ref 12–46)
MCH: 26.8 pg (ref 26.0–34.0)
MCHC: 33 g/dL (ref 30.0–36.0)
MCV: 81.2 fL (ref 78.0–100.0)
MONO ABS: 0.8 10*3/uL (ref 0.1–1.0)
MONOS PCT: 7 % (ref 3–12)
MPV: 10.5 fL (ref 8.6–12.4)
NEUTROS ABS: 7.6 10*3/uL (ref 1.7–7.7)
Neutrophils Relative %: 70 % (ref 43–77)
Platelets: 242 10*3/uL (ref 150–400)
RBC: 5.22 MIL/uL — ABNORMAL HIGH (ref 3.87–5.11)
RDW: 14 % (ref 11.5–15.5)
WBC: 10.8 10*3/uL — ABNORMAL HIGH (ref 4.0–10.5)

## 2014-12-11 LAB — LIPID PANEL
CHOL/HDL RATIO: 2.7 ratio (ref <=5.0)
Cholesterol: 156 mg/dL (ref 125–200)
HDL: 57 mg/dL (ref >=46)
LDL Cholesterol: 84 mg/dL (ref <130)
TRIGLYCERIDES: 76 mg/dL (ref <150)
VLDL: 15 mg/dL (ref <30)

## 2014-12-11 LAB — COMPREHENSIVE METABOLIC PANEL
ALBUMIN: 4.4 g/dL (ref 3.6–5.1)
ALK PHOS: 100 U/L (ref 33–130)
ALT: 17 U/L (ref 6–29)
AST: 19 U/L (ref 10–35)
BILIRUBIN TOTAL: 0.8 mg/dL (ref 0.2–1.2)
BUN: 20 mg/dL (ref 7–25)
CALCIUM: 9.6 mg/dL (ref 8.6–10.4)
CO2: 28 mmol/L (ref 20–31)
Chloride: 101 mmol/L (ref 98–110)
Creat: 0.95 mg/dL — ABNORMAL HIGH (ref 0.60–0.93)
GLUCOSE: 103 mg/dL — AB (ref 65–99)
POTASSIUM: 3.6 mmol/L (ref 3.5–5.3)
Sodium: 144 mmol/L (ref 135–146)
Total Protein: 7.2 g/dL (ref 6.1–8.1)

## 2014-12-11 MED ORDER — SIMVASTATIN 20 MG PO TABS: 20.0000 mg | ORAL_TABLET | Freq: Every day | ORAL | Status: DC

## 2014-12-11 MED ORDER — AMLODIPINE BESYLATE 5 MG PO TABS: 5.0000 mg | ORAL_TABLET | Freq: Every day | ORAL | Status: DC

## 2014-12-11 MED ORDER — BISOPROLOL-HYDROCHLOROTHIAZIDE 10-6.25 MG PO TABS: 1.0000 | ORAL_TABLET | Freq: Every day | ORAL | Status: DC

## 2014-12-11 NOTE — Progress Notes (Signed)
   Subjective:    Patient ID: Alexandra Bowen, female    DOB: [birth year], 79 y.o.   MRN: [id number]  HPI She is here for an interval evaluation. She does have right knee pain and is concerned about having surgery. She has seen her orthopedic surgeon and would like my input into this. She does have osteopenia but is not interested in a DEXA scan. She continues to do well on her amlodipine and Ziac for hypertension. She has no difficulty from simvastatin. Her exercise is quite minimal because of the knee pain. She does get her ADLs down without too much difficulty. She continues to be followed for her breast cancer and this seems to be going well. She is now seen on a yearly basis. She does have a living will. Family history, social history, health maintenance and immunizations were reviewed.She has no chest pain, shortness of breath, GI issues.  Review of Systems  All other systems reviewed and are negative.      Objective:   Physical Exam Alert and in no distress. Tympanic membranes and canals are normal. Pharyngeal area is normal. Neck is supple without adenopathy or thyromegaly. Cardiac exam shows a regular sinus rhythm without murmurs or gallops. Lungs are clear to auscultation.Homero exam shows no masses or tenderness.        Assessment & Plan:  Obesity (BMI 30-39.9) - Plan: CBC with Differential/Platelet, Comprehensive metabolic panel, Lipid panel  Essential hypertension - Plan: bisoprolol-hydrochlorothiazide (ZIAC) 10-6.25 MG per tablet, amLODipine (NORVASC) 5 MG tablet, CBC with Differential/Platelet, Comprehensive metabolic panel  Hyperlipidemia LDL goal <100 - Plan: simvastatin (ZOCOR) 20 MG tablet, Lipid panel  Arthritis  Osteopenia  Family history of heart disease in female family member before age 19 - Plan: CBC with Differential/Platelet, Comprehensive metabolic panel, Lipid panel  Cancer of upper-outer quadrant of female breast, right - Plan: CBC with  Differential/Platelet, Comprehensive metabolic panel She will continue to be followed by oncology. She will continue on her present medication regimens. Discussed DEXA and again she was not interested. Recommend she return here for flu shot. Also discussed knee replacement and strongly encouraged her to his sitter this since it is causing a great deal of difficulty. Over 45 minutes, greater than 50% spent in counseling and coordination of care.

## 2015-01-08 DIAGNOSIS — C50412 Malignant neoplasm of upper-outer quadrant of left female breast: Secondary | ICD-10-CM | POA: Diagnosis not present

## 2015-01-22 ENCOUNTER — Other Ambulatory Visit (INDEPENDENT_AMBULATORY_CARE_PROVIDER_SITE_OTHER): Payer: Commercial Managed Care - HMO

## 2015-01-22 DIAGNOSIS — Z23 Encounter for immunization: Secondary | ICD-10-CM

## 2015-07-24 ENCOUNTER — Other Ambulatory Visit: Payer: Self-pay | Admitting: Family Medicine

## 2015-07-24 ENCOUNTER — Other Ambulatory Visit: Payer: Self-pay

## 2015-07-24 DIAGNOSIS — Z853 Personal history of malignant neoplasm of breast: Secondary | ICD-10-CM

## 2015-08-22 ENCOUNTER — Ambulatory Visit
Admission: RE | Admit: 2015-08-22 | Discharge: 2015-08-22 | Disposition: A | Discharge status: Home / Self Care | Payer: Commercial Managed Care - HMO | Source: Ambulatory Visit | Attending: Family Medicine | Admitting: Family Medicine

## 2015-08-22 DIAGNOSIS — Z853 Personal history of malignant neoplasm of breast: Secondary | ICD-10-CM

## 2015-08-22 DIAGNOSIS — R928 Other abnormal and inconclusive findings on diagnostic imaging of breast: Secondary | ICD-10-CM | POA: Diagnosis not present

## 2015-11-08 DIAGNOSIS — H5213 Myopia, bilateral: Secondary | ICD-10-CM | POA: Diagnosis not present

## 2015-11-08 DIAGNOSIS — H5210 Myopia, unspecified eye: Secondary | ICD-10-CM | POA: Diagnosis not present

## 2015-11-20 DIAGNOSIS — D3132 Benign neoplasm of left choroid: Secondary | ICD-10-CM | POA: Diagnosis not present

## 2015-11-20 DIAGNOSIS — H353132 Nonexudative age-related macular degeneration, bilateral, intermediate dry stage: Secondary | ICD-10-CM | POA: Diagnosis not present

## 2015-11-20 DIAGNOSIS — H25811 Combined forms of age-related cataract, right eye: Secondary | ICD-10-CM | POA: Diagnosis not present

## 2015-11-20 DIAGNOSIS — H2512 Age-related nuclear cataract, left eye: Secondary | ICD-10-CM | POA: Diagnosis not present

## 2015-12-13 ENCOUNTER — Encounter: Payer: Self-pay | Admitting: Family Medicine

## 2015-12-13 ENCOUNTER — Other Ambulatory Visit: Payer: Self-pay | Admitting: Family Medicine

## 2015-12-13 ENCOUNTER — Ambulatory Visit (INDEPENDENT_AMBULATORY_CARE_PROVIDER_SITE_OTHER): Payer: Commercial Managed Care - HMO | Admitting: Family Medicine

## 2015-12-13 VITALS — BP 150/90 | HR 67 | Ht 66.0 in | Wt 201.0 lb

## 2015-12-13 DIAGNOSIS — M8580 Other specified disorders of bone density and structure, unspecified site: Secondary | ICD-10-CM

## 2015-12-13 DIAGNOSIS — I10 Essential (primary) hypertension: Secondary | ICD-10-CM

## 2015-12-13 DIAGNOSIS — Z9849 Cataract extraction status, unspecified eye: Secondary | ICD-10-CM | POA: Insufficient documentation

## 2015-12-13 DIAGNOSIS — E785 Hyperlipidemia, unspecified: Secondary | ICD-10-CM

## 2015-12-13 DIAGNOSIS — Z8249 Family history of ischemic heart disease and other diseases of the circulatory system: Secondary | ICD-10-CM | POA: Diagnosis not present

## 2015-12-13 DIAGNOSIS — E669 Obesity, unspecified: Secondary | ICD-10-CM | POA: Diagnosis not present

## 2015-12-13 DIAGNOSIS — Z23 Encounter for immunization: Secondary | ICD-10-CM

## 2015-12-13 DIAGNOSIS — C50411 Malignant neoplasm of upper-outer quadrant of right female breast: Secondary | ICD-10-CM

## 2015-12-13 DIAGNOSIS — Z87442 Personal history of urinary calculi: Secondary | ICD-10-CM | POA: Diagnosis not present

## 2015-12-13 DIAGNOSIS — H269 Unspecified cataract: Secondary | ICD-10-CM | POA: Diagnosis not present

## 2015-12-13 DIAGNOSIS — M1990 Unspecified osteoarthritis, unspecified site: Secondary | ICD-10-CM

## 2015-12-13 DIAGNOSIS — Z79899 Other long term (current) drug therapy: Secondary | ICD-10-CM | POA: Diagnosis not present

## 2015-12-13 DIAGNOSIS — J3489 Other specified disorders of nose and nasal sinuses: Secondary | ICD-10-CM

## 2015-12-13 LAB — CBC WITH DIFFERENTIAL/PLATELET
BASOS ABS: 0 {cells}/uL (ref 0–200)
Basophils Relative: 0 %
Eosinophils Absolute: 430 cells/uL (ref 15–500)
Eosinophils Relative: 5 %
HEMATOCRIT: 44.5 % (ref 35.0–45.0)
HEMOGLOBIN: 14.4 g/dL (ref 11.7–15.5)
LYMPHS ABS: 2064 {cells}/uL (ref 850–3900)
LYMPHS PCT: 24 %
MCH: 26.7 pg — AB (ref 27.0–33.0)
MCHC: 32.4 g/dL (ref 32.0–36.0)
MCV: 82.4 fL (ref 80.0–100.0)
MONO ABS: 430 {cells}/uL (ref 200–950)
MPV: 10.5 fL (ref 7.5–12.5)
Monocytes Relative: 5 %
NEUTROS PCT: 66 %
Neutro Abs: 5676 cells/uL (ref 1500–7800)
Platelets: 209 10*3/uL (ref 140–400)
RBC: 5.4 MIL/uL — AB (ref 3.80–5.10)
RDW: 13.9 % (ref 11.0–15.0)
WBC: 8.6 10*3/uL (ref 4.0–10.5)

## 2015-12-13 LAB — LIPID PANEL
Cholesterol: 156 mg/dL (ref 125–200)
HDL: 59 mg/dL (ref >=46)
LDL CALC: 76 mg/dL (ref <130)
Total CHOL/HDL Ratio: 2.6 Ratio (ref <=5.0)
Triglycerides: 103 mg/dL (ref <150)
VLDL: 21 mg/dL (ref <30)

## 2015-12-13 LAB — COMPREHENSIVE METABOLIC PANEL
ALBUMIN: 4.3 g/dL (ref 3.6–5.1)
ALT: 13 U/L (ref 6–29)
AST: 16 U/L (ref 10–35)
Alkaline Phosphatase: 86 U/L (ref 33–130)
BUN: 17 mg/dL (ref 7–25)
CALCIUM: 9.6 mg/dL (ref 8.6–10.4)
CHLORIDE: 103 mmol/L (ref 98–110)
CO2: 25 mmol/L (ref 20–31)
Creat: 0.94 mg/dL — ABNORMAL HIGH (ref 0.60–0.93)
GLUCOSE: 110 mg/dL — AB (ref 65–99)
POTASSIUM: 4.1 mmol/L (ref 3.5–5.3)
Sodium: 142 mmol/L (ref 135–146)
Total Bilirubin: 0.8 mg/dL (ref 0.2–1.2)
Total Protein: 7.1 g/dL (ref 6.1–8.1)

## 2015-12-13 MED ORDER — AMLODIPINE BESYLATE 5 MG PO TABS: 5.0000 mg | ORAL_TABLET | Freq: Every day | ORAL | 3 refills | Status: DC

## 2015-12-13 MED ORDER — SIMVASTATIN 20 MG PO TABS: 20.0000 mg | ORAL_TABLET | Freq: Every day | ORAL | 3 refills | Status: DC

## 2015-12-13 MED ORDER — BISOPROLOL-HYDROCHLOROTHIAZIDE 10-6.25 MG PO TABS: 1.0000 | ORAL_TABLET | Freq: Every day | ORAL | 3 refills | Status: DC

## 2015-12-13 NOTE — Progress Notes (Signed)
Subjective:   HPI  Alexandra Bowen is a 80 y.o. female who presents for a complete physical.  Medical care team includes:  Dr.Bynes eye  Dr.gross eye  Dr.Owens bone  Dr.Fuller dent.   Preventative care: Last ophthalmology visit:10/2015 Last dental visit: 2017 Last colonoscopy:04/28/05 Last mammogram: 08/22/15 Last gynecologi Last EKG:10/04/12 Last labs:12/11/14  Prior vaccinations: TD or Tdap:11/04/06 Influenza:10/16 Pneumococcal:23:08/25/05 12/06/12 Shingles/Zostavax: 08/25/05  Advanced directive:Yes  Concerns: She does have a history of breast cancer and is in routine follow-up mode. She also has a history of osteopenia and is on multivitamins for that. She is cataracts and is scheduled for one removal and plans have another one in the near future. She continues on her blood pressure medications as well as simvastatin and is having no difficulty with them. She does have arthritis and eventually will get a knee replacement but not in the near future. She has a remote history of renal stone but none recently. Also family history of heart disease and continues on her statin as mentioned above. Her exercise is quite minimal. She does need refills on several for medications. She does need a colonoscopy and I offered Cologuard however she wants to do that next year.   Reviewed their medical, surgical, family, social, medication, and allergy history and updated chart as appropriate.    Review of Systems Constitutional: -fever, -chills, -sweats, -unexpected weight change, -decreased appetite, -fatigue Allergy: -sneezing, -itching, -congestion Dermatology: -changing moles, --rash, -lumps ENT: -runny nose, -ear pain, -sore throat, -hoarseness, -sinus pain, -teeth pain, - ringing in ears, -hearing loss, -nosebleeds Cardiology: -chest pain, -palpitations, -swelling, -difficulty breathing when lying flat, -waking up short of breath Respiratory: -cough, -shortness of breath, -difficulty breathing  with exercise or exertion, -wheezing, -coughing up blood Gastroenterology: -abdominal pain, -nausea, -vomiting, -diarrhea, -constipation, -blood in stool, -changes in bowel movement, -difficulty swallowing or eating Hematology: -bleeding, -bruising  Musculoskeletal: -joint aches, -muscle aches, -joint swelling, -back pain, -neck pain, -cramping, -changes in gait Ophthalmology: denies vision changes, eye redness, itching, discharge Urology: -burning with urination, -difficulty urinating, -blood in urine, -urinary frequency, -urgency, -incontinence Neurology: -headache, -weakness, -tingling, -numbness, -memory loss, -falls, -dizziness Psychology: -depressed mood, -agitation, -sleep problems     Objective:   Physical Exam General appearance: alert, no distress, WD/WN,  Skin: 1 cm raised slightly vascular lesion is noted on the nose HEENT: normocephalic, conjunctiva/corneas normal, sclerae anicteric, PERRLA, EOMi, nares patent, no discharge or erythema, pharynx normal Oral cavity: MMM, tongue normal, teeth normal Neck: supple, no lymphadenopathy, no thyromegaly, no masses, normal ROM Chest: non tender, normal shape and expansion Heart: RRR, normal S1, S2, no murmurs Lungs: CTA bilaterally, no wheezes, rhonchi, or rales Abdomen: +bs, soft, non tender, non distended, no masses, no hepatomegaly, no splenomegaly, no bruits Extremities: no edema, no cyanosis, no clubbing Pulses: 2+ symmetric, upper and lower extremities, normal cap refill Neurological: alert, oriented x 3, CN2-12 intact, strength normal upper extremities and lower extremities, sensation normal throughout, DTRs 2+ throughout, no cerebellar signs, gait normal Psychiatric: normal affect, behavior normal, pleasant    Assessment and Plan :   Need for prophylactic vaccination against Streptococcus pneumoniae (pneumococcus) - Plan: Pneumococcal conjugate vaccine 13-valent  Essential hypertension - Plan: CBC with Differential/Platelet,  Comprehensive metabolic panel, bisoprolol-hydrochlorothiazide (ZIAC) 10-6.25 MG tablet, amLODipine (NORVASC) 5 MG tablet  Arthritis  Osteopenia - Plan: CBC with Differential/Platelet, Comprehensive metabolic panel  Obesity (BMI 30-39.9) - Plan: CBC with Differential/Platelet, Comprehensive metabolic panel, Lipid panel  Hyperlipidemia LDL goal <100 - Plan: Lipid panel,  simvastatin (ZOCOR) 20 MG tablet  Malignant neoplasm of upper-outer quadrant of right female breast ([place])  Family history of heart disease in female family member before age 22  Cataracts, bilateral  History of kidney stones  Nasal lesion I offered to set up an appointment with dermatology and see states that she will do this on her own. Her immunizations were reviewed. Medications were updated. Recommend she return here for flu shot.     Physical exam - discussed healthy lifestyle, diet, exercise, preventative care, vaccinations, and addressed their concerns.  Handout given. Follow-up as needed

## 2015-12-13 NOTE — Patient Instructions (Signed)
  Alexandra Bowen , Thank you for taking time to come for your Medicare Wellness Visit. I appreciate your ongoing commitment to your health goals. Please review the following plan we discussed and let me know if I can assist you in the future.   These are the goals we discussed: Goals    None      This is a list of the screening recommended for you and due dates:  Health Maintenance  Topic Date Due  . Flu Shot  11/27/2015  . Tetanus Vaccine  11/03/2016  . DEXA scan (bone density measurement)  Completed  . Shingles Vaccine  Completed  . Pneumonia vaccines  Completed

## 2015-12-14 LAB — HEMOGLOBIN A1C
HEMOGLOBIN A1C: 5.6 % (ref <5.7)
MEAN PLASMA GLUCOSE: 114 mg/dL

## 2015-12-20 DIAGNOSIS — H25041 Posterior subcapsular polar age-related cataract, right eye: Secondary | ICD-10-CM | POA: Diagnosis not present

## 2015-12-20 DIAGNOSIS — H2511 Age-related nuclear cataract, right eye: Secondary | ICD-10-CM | POA: Diagnosis not present

## 2016-01-14 DIAGNOSIS — C50412 Malignant neoplasm of upper-outer quadrant of left female breast: Secondary | ICD-10-CM | POA: Diagnosis not present

## 2016-02-04 DIAGNOSIS — H2512 Age-related nuclear cataract, left eye: Secondary | ICD-10-CM | POA: Diagnosis not present

## 2016-02-05 ENCOUNTER — Other Ambulatory Visit (INDEPENDENT_AMBULATORY_CARE_PROVIDER_SITE_OTHER): Payer: Commercial Managed Care - HMO

## 2016-02-05 DIAGNOSIS — Z23 Encounter for immunization: Secondary | ICD-10-CM | POA: Diagnosis not present

## 2016-02-07 DIAGNOSIS — H2512 Age-related nuclear cataract, left eye: Secondary | ICD-10-CM | POA: Diagnosis not present

## 2016-03-13 ENCOUNTER — Telehealth: Payer: Self-pay | Admitting: Medical

## 2016-03-13 NOTE — Telephone Encounter (Signed)
Alexandra Bowen x [phone],  from [facility] called, patient has appt for 04/09/16, and they need referral put in/authorization.

## 2016-03-14 NOTE — Telephone Encounter (Signed)
I have put referral in °

## 2016-03-18 ENCOUNTER — Telehealth: Payer: Self-pay

## 2016-03-18 NOTE — Telephone Encounter (Signed)
In acuity auth # [id number]

## 2016-03-18 NOTE — Telephone Encounter (Signed)
Faxed

## 2016-03-18 NOTE — Telephone Encounter (Signed)
Pt needs referral to  ortho for Dr. Alvan Dame. Pt called to make follow up for right knee pain, and they told her she needs referral. Please fax to [phone]. Thank you, Wells Guiles

## 2016-04-01 ENCOUNTER — Telehealth: Payer: Self-pay | Admitting: Family Medicine

## 2016-04-01 NOTE — Telephone Encounter (Signed)
This has been taken care of G.Ortho is calling pt

## 2016-04-01 NOTE — Telephone Encounter (Signed)
Pt called stating that Dr Aurea Graff office told her that they have not received a referral from [place] and pt's appt is coming up at that office. Advise pt that we have noted that we faxed referral on 11/21

## 2016-04-09 DIAGNOSIS — M1711 Unilateral primary osteoarthritis, right knee: Secondary | ICD-10-CM | POA: Diagnosis not present

## 2016-04-09 DIAGNOSIS — G8929 Other chronic pain: Secondary | ICD-10-CM | POA: Diagnosis not present

## 2016-04-09 DIAGNOSIS — M1712 Unilateral primary osteoarthritis, left knee: Secondary | ICD-10-CM | POA: Diagnosis not present

## 2016-04-09 DIAGNOSIS — M25561 Pain in right knee: Secondary | ICD-10-CM | POA: Diagnosis not present

## 2016-04-09 DIAGNOSIS — M25562 Pain in left knee: Secondary | ICD-10-CM | POA: Diagnosis not present

## 2016-04-23 ENCOUNTER — Ambulatory Visit (INDEPENDENT_AMBULATORY_CARE_PROVIDER_SITE_OTHER): Payer: Commercial Managed Care - HMO | Admitting: Family Medicine

## 2016-04-23 ENCOUNTER — Encounter: Payer: Self-pay | Admitting: Family Medicine

## 2016-04-23 VITALS — BP 128/80 | HR 62 | Temp 98.2°F | Wt 204.4 lb

## 2016-04-23 DIAGNOSIS — Z8249 Family history of ischemic heart disease and other diseases of the circulatory system: Secondary | ICD-10-CM | POA: Diagnosis not present

## 2016-04-23 DIAGNOSIS — R9431 Abnormal electrocardiogram [ECG] [EKG]: Secondary | ICD-10-CM | POA: Diagnosis not present

## 2016-04-23 DIAGNOSIS — J069 Acute upper respiratory infection, unspecified: Secondary | ICD-10-CM

## 2016-04-23 DIAGNOSIS — M1990 Unspecified osteoarthritis, unspecified site: Secondary | ICD-10-CM

## 2016-04-23 DIAGNOSIS — E785 Hyperlipidemia, unspecified: Secondary | ICD-10-CM | POA: Diagnosis not present

## 2016-04-23 DIAGNOSIS — I10 Essential (primary) hypertension: Secondary | ICD-10-CM | POA: Diagnosis not present

## 2016-04-23 NOTE — Progress Notes (Signed)
   Subjective:    Patient ID: Alexandra Bowen, female    DOB: Sep 10, 1935, 80 y.o.   MRN: [id number]  HPI She is here for preoperative evaluation prior to right TKR. This is scheduled for late January. Recently she did develop URI symptoms with nasal congestion, rhinorrhea and slight cough. She has been using Mucinex DM. She does not complain of chest pain, shortness breath, PND or DOE. There is a positive family history heart disease and she is presently on the statin. Her last EKG was in 2014.   Review of Systems     Objective:   Physical Exam Alert and in no distress. Tympanic membranes and canals are normal. Pharyngeal area is normal. Neck is supple without adenopathy or thyromegaly. Cardiac exam shows a regular sinus rhythm without murmurs or gallops. Lungs are clear to auscultation. EKG does show ST-T changes in the lateral leads. This is new.       Assessment & Plan:  Essential hypertension - Plan: EKG 12-Lead  Arthritis  Hyperlipidemia LDL goal <100  Family history of heart disease in female family member before age 50 - Plan: EKG 12-Lead, Ambulatory referral to Cardiology  Abnormal EKG - Plan: Ambulatory referral to Cardiology I explained that this will need to be cleared up prior to surgery and will refer to cardiology. Recommend supportive care for her URI symptoms.

## 2016-05-08 ENCOUNTER — Ambulatory Visit (INDEPENDENT_AMBULATORY_CARE_PROVIDER_SITE_OTHER): Payer: Medicare HMO | Admitting: Cardiology

## 2016-05-08 ENCOUNTER — Encounter: Payer: Self-pay | Admitting: Cardiology

## 2016-05-08 ENCOUNTER — Ambulatory Visit: Payer: Commercial Managed Care - HMO | Admitting: Cardiology

## 2016-05-08 VITALS — BP 184/82 | HR 70 | Ht 66.0 in | Wt 204.8 lb

## 2016-05-08 DIAGNOSIS — R9431 Abnormal electrocardiogram [ECG] [EKG]: Secondary | ICD-10-CM | POA: Diagnosis not present

## 2016-05-08 DIAGNOSIS — Z8249 Family history of ischemic heart disease and other diseases of the circulatory system: Secondary | ICD-10-CM | POA: Diagnosis not present

## 2016-05-08 DIAGNOSIS — Z01818 Encounter for other preprocedural examination: Secondary | ICD-10-CM | POA: Diagnosis not present

## 2016-05-08 DIAGNOSIS — I10 Essential (primary) hypertension: Secondary | ICD-10-CM

## 2016-05-08 NOTE — Progress Notes (Signed)
Cardiology Office Note    Date:  05/08/2016   ID:  [name], DOB Mar 04, 1936, MRN [id number]  PCP:  Wyatt Haste, MD  Cardiologist:  Fransico Him, MD   Chief Complaint  Patient presents with  . New Evaluation    preoperative clearance, HTN, abnormal EKG    History of Present Illness:  Alexandra Bowen is a 81 y.o. female with a history of dyslipidemia, HTN and recently saw her PCP for preoperative cardiac clearance for right TKR.  She is now referred to Cardiology for cardiac clearance due to cardiac risk factors.  She denies any chest pain, SOB, DOE, LE edema, PND, orthopnea, dizziness, palpitations or syncope.  She does have a family history of early CAD but has never smoked.  She had a preop EKG done that showed anterolateral T wave abnormality and is referred for further evaluation.      Past Medical History:  Diagnosis Date  . Allergy   . Arthritis   . Breast cancer ([place])   . 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 Dyslipidemia   . Glucose intolerance (impaired glucose tolerance)   . Hypertension    WHITE COAT SYNDROME  . Obesity   . Renal stone     Past Surgical History:  Procedure Laterality Date  . BREAST LUMPECTOMY WITH NEEDLE LOCALIZATION Left 10/06/2012   Procedure: LEFT BREAST LUMPECTOMY WITH NEEDLE LOCALIZATION;  Surgeon: Stark Klein, MD;  Location: [facility];  Service: General;  Laterality: Left;  needle localization at 10:30 breast center of [place]   . BREAST SURGERY Left 10/06/2012   NL Lumpectomy  . BUNIONECTOMY     both feet  . [place]   routine-neg  . COLONOSCOPY    . DILATION AND CURETTAGE OF UTERUS    . HAMMER TOE SURGERY     both feet    Current Medications: Outpatient Medications Prior to Visit  Medication Sig Dispense Refill  . amLODipine (NORVASC) 5 MG tablet Take 1 tablet (5 mg total) by mouth daily. 90 tablet 3  . aspirin 81 MG tablet Take 81 mg by mouth  daily.      . bisoprolol-hydrochlorothiazide (ZIAC) 10-6.25 MG tablet Take 1 tablet by mouth daily. 90 tablet 3  . calcium citrate-vitamin D 200-200 MG-UNIT TABS Take 1 tablet by mouth 3 (three) times daily.      . Ibuprofen (ADVIL PO) Take by mouth as needed.    . Multiple Vitamins-Minerals (MULTIVITAMIN WITH MINERALS) tablet Take 1 tablet by mouth daily.      . simvastatin (ZOCOR) 20 MG tablet Take 1 tablet (20 mg total) by mouth at bedtime. 90 tablet 3   Facility-Administered Medications Prior to Visit  Medication Dose Route Frequency Provider Last Rate Last Dose  . influenza  inactive virus vaccine (FLUZONE/FLUARIX) injection 0.5 mL  0.5 mL Intramuscular Once Denita Lung, MD         Allergies:   Patient has no known allergies.   Social History   Social History  . Marital status: Widowed    Spouse name: N/A  . Number of children: N/A  . Years of education: N/A   Social History Main Topics  . Smoking status: Never Smoker  . Smokeless tobacco: Never Used  . Alcohol use No  . Drug use: No  . Sexual activity: Not Currently   Other Topics Concern  . None   Social History Narrative  .  None     Family History:  The patient's family history includes CVA in her brother; Diabetes in her mother; Heart disease in her brother, brother, brother, and sister; Lung cancer in her brother, sister, and sister; Throat cancer in her father.   ROS:   Please see the history of present illness.    ROS All other systems reviewed and are negative.  No flowsheet data found.     PHYSICAL EXAM:   VS:  BP (!) 184/82 (BP Location: Right Arm, Patient Position: Sitting, Cuff Size: Large)   Pulse 70   Ht 5\' 6"  (1.676 m)   Wt 204 lb 12.8 oz (92.9 kg)   BMI 33.06 kg/m    GEN: Well nourished, well developed, in no acute distress  HEENT: normal  Neck: no JVD, carotid bruits, or masses Cardiac: RRR; no murmurs, rubs, or gallops,no edema.  Intact distal pulses bilaterally.  Respiratory:  clear  to auscultation bilaterally, normal work of breathing GI: soft, nontender, nondistended, + BS MS: no deformity or atrophy  Skin: warm and dry, no rash Neuro:  Alert and Oriented x 3, Strength and sensation are intact Psych: euthymic mood, full affect  Wt Readings from Last 3 Encounters:  05/08/16 204 lb 12.8 oz (92.9 kg)  04/23/16 204 lb 6.4 oz (92.7 kg)  12/13/15 201 lb (91.2 kg)      Studies/Labs Reviewed:   EKG:  EKG is not ordered today.   Recent Labs: 12/13/2015: ALT 13; BUN 17; Creat 0.94; Hemoglobin 14.4; Platelets 209; Potassium 4.1; Sodium 142   Lipid Panel    Component Value Date/Time   CHOL 156 12/13/2015 0001   TRIG 103 12/13/2015 0001   HDL 59 12/13/2015 0001   CHOLHDL 2.6 12/13/2015 0001   VLDL 21 12/13/2015 0001   LDLCALC 76 12/13/2015 0001    Additional studies/ records that were reviewed today include:  Office visit notes and EKG    ASSESSMENT:    1. Family history of heart disease in female family member before age 40   2. Preoperative clearance   3. Essential hypertension   4. Abnormal EKG      PLAN:  In order of problems listed above:  1. Family history of CAD.  She has not had any anginal symptoms but does have cardiac risk factors including HTN, dyslipidemia and glucose intolerance. Her EKG is abnormal with anterolateral T wave abnormality worrisome for ischemia.   She is unable to walk 4 blocks due to her knee pain so cannot adequately assess for exertional angina.  I will set her up for a nuclear stress test and check a 2D echo to assess LVF. 2. Preoperative cardiac clearance.  She is asymptomatic from a cardiac standpoint but has risk factors and abnormal EKG - see #1. 3. HTN - BP controlled on current meds.  Continue amlodipine/BB and diuretic.  4. Abnormal EKG with anterolateral T wave abnormality - see #1    Medication Adjustments/Labs and Tests Ordered: Current medicines are reviewed at length with the patient today.  Concerns  regarding medicines are outlined above.  Medication changes, Labs and Tests ordered today are listed in the Patient Instructions below.  There are no Patient Instructions on file for this visit.   Signed, Fransico Him, MD  05/08/2016 12:19 PM    [address], [place], [place]  [address] Phone: [phone]; Fax: [phone]

## 2016-05-08 NOTE — Patient Instructions (Signed)
Medication Instructions:  Your physician recommends that you continue on your current medications as directed. Please refer to the Current Medication list given to you today.   Labwork: None  Testing/Procedures: Your physician has requested that you have an echocardiogram. Echocardiography is a painless test that uses sound waves to create images of your heart. It provides your doctor with information about the size and shape of your heart and how well your heart's chambers and valves are working. This procedure takes approximately one hour. There are no restrictions for this procedure.   Your physician has requested that you have a lexiscan myoview. For further information please visit [email or web address]. Please follow instruction sheet, as given.  Follow-Up: Your physician recommends that you schedule a follow-up appointment AS NEEDED with Dr. Turner pending study results.  Any Other Special Instructions Will Be Listed Below (If Applicable).     If you need a refill on your cardiac medications before your next appointment, please call your pharmacy.   

## 2016-05-14 NOTE — H&P (Signed)
TOTAL KNEE ADMISSION H&P  Patient is being admitted for right total knee arthroplasty.  Subjective:  Chief Complaint:    Right knee primary OA / pain  HPI: Alexandra Bowen, 81 y.o. female, has a history of pain and functional disability in the right knee due to arthritis and has failed non-surgical conservative treatments for greater than 12 weeks to includeNSAID's and/or analgesics, corticosteriod injections and activity modification.  Onset of symptoms was gradual, starting 1+ years ago with gradually worsening course since that time. The patient noted no past surgery on the right knee(s).  Patient currently rates pain in the right knee(s) at 7 out of 10 with activity. Patient has worsening of pain with activity and weight bearing, pain that interferes with activities of daily living, pain with passive range of motion, crepitus and joint swelling.  Patient has evidence of periarticular osteophytes and joint space narrowing by imaging studies.  There is no active infection.   Risks, benefits and expectations were discussed with the patient.  Risks including but not limited to the risk of anesthesia, blood clots, nerve damage, blood vessel damage, failure of the prosthesis, infection and up to and including death.  Patient understand the risks, benefits and expectations and wishes to proceed with surgery.    PCP: Wyatt Haste, MD  D/C Plans:       Home   Post-op Meds:       No Rx given  Tranexamic Acid:      To be given - IV   Decadron:      Is to be given  FYI:     ASA  Norco    Patient Active Problem List   Diagnosis Date Noted  . Preoperative clearance 05/08/2016  . Abnormal EKG 05/08/2016  . History of kidney stones 12/13/2015  . Cataracts, bilateral 12/13/2015  . 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 Hypertension 11/22/2010  . Hyperlipidemia LDL goal <100 11/22/2010  . Arthritis 11/22/2010  . Osteopenia 11/22/2010  . Family history of  heart disease in female family member before age 27 11/22/2010  . Obesity (BMI 30-39.9) 11/22/2010   Past Medical History:  Diagnosis Date  . Allergy   . Arthritis   . Breast cancer ([place])   . 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 Dyslipidemia   . Glucose intolerance (impaired glucose tolerance)   . Hypertension    WHITE COAT SYNDROME  . Obesity   . Renal stone     Past Surgical History:  Procedure Laterality Date  . BREAST LUMPECTOMY WITH NEEDLE LOCALIZATION Left 10/06/2012   Procedure: LEFT BREAST LUMPECTOMY WITH NEEDLE LOCALIZATION;  Surgeon: Stark Klein, MD;  Location: [facility];  Service: General;  Laterality: Left;  needle localization at 10:30 breast center of [place]   . BREAST SURGERY Left 10/06/2012   NL Lumpectomy  . BUNIONECTOMY     both feet  . [place]   routine-neg  . COLONOSCOPY    . DILATION AND CURETTAGE OF UTERUS    . HAMMER TOE SURGERY     both feet    No prescriptions prior to admission.   No Known Allergies   Social History  Substance Use Topics  . Smoking status: Never Smoker  . Smokeless tobacco: Never Used  . Alcohol use No    Family History  Problem Relation Age of Onset  . Diabetes Mother   . Throat cancer  Father   . Lung cancer Sister   . Heart disease Brother   . Heart disease Brother   . Heart disease Brother   . Lung cancer Brother   . CVA Brother   . Lung cancer Sister   . Heart disease Sister      Review of Systems  Constitutional: Negative.   HENT: Negative.   Eyes: Negative.   Respiratory: Negative.   Cardiovascular: Negative.   Gastrointestinal: Negative.   Genitourinary: Positive for frequency.  Musculoskeletal: Positive for joint pain.  Skin: Negative.   Neurological: Negative.   Endo/Heme/Allergies: Positive for environmental allergies.  Psychiatric/Behavioral: Negative.     Objective:  Physical Exam  Constitutional: She is oriented to  person, place, and time. She appears well-developed.  HENT:  Head: Normocephalic.  Mouth/Throat: She has dentures.  Eyes: Pupils are equal, round, and reactive to light.  Neck: Neck supple. No JVD present. No tracheal deviation present. No thyromegaly present.  Cardiovascular: Normal rate, regular rhythm, normal heart sounds and intact distal pulses.   Respiratory: Effort normal and breath sounds normal. No respiratory distress. She has no wheezes.  GI: Soft. There is no tenderness. There is no guarding.  Musculoskeletal:       Right knee: She exhibits decreased range of motion, swelling and bony tenderness. She exhibits no ecchymosis, no deformity, no laceration and no erythema. Tenderness found.  Lymphadenopathy:    She has no cervical adenopathy.  Neurological: She is alert and oriented to person, place, and time.  Skin: Skin is warm and dry.  Psychiatric: She has a normal mood and affect.      Labs:  Estimated body mass index is 33.06 kg/m as calculated from the following:   Height as of 05/08/16: 5\' 6"  (1.676 m).   Weight as of 05/08/16: 92.9 kg (204 lb 12.8 oz).   Imaging Review Plain radiographs demonstrate severe degenerative joint disease of the right knee(s). The overall alignment is significant valgus. The bone quality appears to be good for age and reported activity level.  Assessment/Plan:  End stage arthritis, right knee   The patient history, physical examination, clinical judgment of the provider and imaging studies are consistent with end stage degenerative joint disease of the right knee(s) and total knee arthroplasty is deemed medically necessary. The treatment options including medical management, injection therapy arthroscopy and arthroplasty were discussed at length. The risks and benefits of total knee arthroplasty were presented and reviewed. The risks due to aseptic loosening, infection, stiffness, patella tracking problems, thromboembolic complications and  other imponderables were discussed. The patient acknowledged the explanation, agreed to proceed with the plan and consent was signed. Patient is being admitted for inpatient treatment for surgery, pain control, PT, OT, prophylactic antibiotics, VTE prophylaxis, progressive ambulation and ADL's and discharge planning. The patient is planning to be discharged home.     [place] P[place] Galen Malkowski   PA-C  05/14/2016, 12:38 PM

## 2016-05-20 ENCOUNTER — Other Ambulatory Visit (HOSPITAL_COMMUNITY): Payer: Self-pay | Admitting: Emergency Medicine

## 2016-05-20 ENCOUNTER — Other Ambulatory Visit (HOSPITAL_COMMUNITY): Payer: Self-pay | Admitting: Anesthesiology

## 2016-05-20 NOTE — Patient Instructions (Signed)
Alexandra Bowen  05/20/2016   Your procedure is scheduled on: 05-27-16  Report to [facility] Main  Entrance take [facility]  elevators to 3rd floor to  [place] at 1250AM.  Call this number if you have problems the morning of surgery [phone]   Remember: ONLY 1 PERSON MAY GO WITH YOU TO SHORT STAY TO GET  READY MORNING OF [place].  Do not eat food :After Midnight. YOU MAY HAVE CLEAR LIQUIDS UNTIL 950AM ON DAY OF SURGERY. NOTHING BY MOUTH AFTER 950AM!     Take these medicines the morning of surgery with A SIP OF WATER: Amlodipine(Norvasc)                                You may not have any metal on your body including hair pins and              piercings  Do not wear jewelry, make-up, lotions, powders or perfumes, deodorant             Do not wear nail polish.  Do not shave  48 hours prior to surgery.              Men may shave face and neck.   Do not bring valuables to the hospital. [place].  Contacts, dentures or bridgework may not be worn into surgery.  Leave suitcase in the car. After surgery it may be brought to your room.              Please read over the following fact sheets you were given: _____________________________________________________________________                CLEAR LIQUID DIET   Foods Allowed                                                                     Foods Excluded  Coffee and tea, regular and decaf                             liquids that you cannot  Plain Jell-O in any flavor                                             see through such as: Fruit ices (not with fruit pulp)                                     milk, soups, orange juice  Iced Popsicles                                    All solid food Carbonated beverages, regular and diet  Cranberry, grape and apple juices Sports drinks like Gatorade Lightly seasoned clear  broth or consume(fat free) Sugar, honey syrup  Sample Menu Breakfast                                Lunch                                     Supper Cranberry juice                    Beef broth                            Chicken broth Jell-O                                     Grape juice                           Apple juice Coffee or tea                        Jell-O                                      Popsicle                                                Coffee or tea                        Coffee or tea  _____________________________________________________________________  [facility] - Preparing for Surgery Before surgery, you can play an important role.  Because skin is not sterile, your skin needs to be as free of germs as possible.  You can reduce the number of germs on your skin by washing with CHG (chlorahexidine gluconate) soap before surgery.  CHG is an antiseptic cleaner which kills germs and bonds with the skin to continue killing germs even after washing. Please DO NOT use if you have an allergy to CHG or antibacterial soaps.  If your skin becomes reddened/irritated stop using the CHG and inform your nurse when you arrive at Short Stay. Do not shave (including legs and underarms) for at least 48 hours prior to the first CHG shower.  You may shave your face/neck. Please follow these instructions carefully:  1.  Shower with CHG Soap the night before surgery and the  morning of Surgery.  2.  If you choose to wash your hair, wash your hair first as usual with your  normal  shampoo.  3.  After you shampoo, rinse your hair and body thoroughly to remove the  shampoo.                           4.  Use CHG as you would any other liquid soap.  You can apply chg directly  to the skin and wash  Gently with a scrungie or clean washcloth.  5.  Apply the CHG Soap to your body ONLY FROM THE NECK DOWN.   Do not use on face/ open                           Wound or open  sores. Avoid contact with eyes, ears mouth and genitals (private parts).                       Wash face,  Genitals (private parts) with your normal soap.             6.  Wash thoroughly, paying special attention to the area where your surgery  will be performed.  7.  Thoroughly rinse your body with warm water from the neck down.  8.  DO NOT shower/wash with your normal soap after using and rinsing off  the CHG Soap.                9.  Pat yourself dry with a clean towel.            10.  Wear clean pajamas.            11.  Place clean sheets on your bed the night of your first shower and do not  sleep with pets. Day of Surgery : Do not apply any lotions/deodorants the morning of surgery.  Please wear clean clothes to the hospital/surgery center.  FAILURE TO FOLLOW THESE INSTRUCTIONS MAY RESULT IN THE CANCELLATION OF YOUR SURGERY PATIENT SIGNATURE_________________________________  NURSE SIGNATURE__________________________________  ________________________________________________________________________   Adam Phenix  An incentive spirometer is a tool that can help keep your lungs clear and active. This tool measures how well you are filling your lungs with each breath. Taking long deep breaths may help reverse or decrease the chance of developing breathing (pulmonary) problems (especially infection) following:  A long period of time when you are unable to move or be active. BEFORE THE PROCEDURE   If the spirometer includes an indicator to show your best effort, your nurse or respiratory therapist will set it to a desired goal.  If possible, sit up straight or lean slightly forward. Try not to slouch.  Hold the incentive spirometer in an upright position. INSTRUCTIONS FOR USE  1. Sit on the edge of your bed if possible, or sit up as far as you can in bed or on a chair. 2. Hold the incentive spirometer in an upright position. 3. Breathe out normally. 4. Place the mouthpiece  in your mouth and seal your lips tightly around it. 5. Breathe in slowly and as deeply as possible, raising the piston or the ball toward the top of the column. 6. Hold your breath for 3-5 seconds or for as long as possible. Allow the piston or ball to fall to the bottom of the column. 7. Remove the mouthpiece from your mouth and breathe out normally. 8. Rest for a few seconds and repeat Steps 1 through 7 at least 10 times every 1-2 hours when you are awake. Take your time and take a few normal breaths between deep breaths. 9. The spirometer may include an indicator to show your best effort. Use the indicator as a goal to work toward during each repetition. 10. After each set of 10 deep breaths, practice coughing to be sure your lungs are clear. If you have an incision (the cut made at the time of  surgery), support your incision when coughing by placing a pillow or rolled up towels firmly against it. Once you are able to get out of bed, walk around indoors and cough well. You may stop using the incentive spirometer when instructed by your caregiver.  RISKS AND COMPLICATIONS  Take your time so you do not get dizzy or light-headed.  If you are in pain, you may need to take or ask for pain medication before doing incentive spirometry. It is harder to take a deep breath if you are having pain. AFTER USE  Rest and breathe slowly and easily.  It can be helpful to keep track of a log of your progress. Your caregiver can provide you with a simple table to help with this. If you are using the spirometer at home, follow these instructions: [place] IF:   You are having difficultly using the spirometer.  You have trouble using the spirometer as often as instructed.  Your pain medication is not giving enough relief while using the spirometer.  You develop fever of 100.5 F (38.1 C) or higher. SEEK IMMEDIATE MEDICAL CARE IF:   You cough up bloody sputum that had not been present  before.  You develop fever of 102 F (38.9 C) or greater.  You develop worsening pain at or near the incision site. MAKE SURE YOU:   Understand these instructions.  Will watch your condition.  Will get help right away if you are not doing well or get worse. Document Released: 08/25/2006 Document Revised: 07/07/2011 Document Reviewed: 10/26/2006 ExitCare Patient Information 2014 [employer], [place].   ________________________________________________________________________  WHAT IS A BLOOD TRANSFUSION? Blood Transfusion Information  A transfusion is the replacement of blood or some of its parts. Blood is made up of multiple cells which provide different functions.  Red blood cells carry oxygen and are used for blood loss replacement.  White blood cells fight against infection.  Platelets control bleeding.  Plasma helps clot blood.  Other blood products are available for specialized needs, such as hemophilia or other clotting disorders. BEFORE THE TRANSFUSION  Who gives blood for transfusions?   Healthy volunteers who are fully evaluated to make sure their blood is safe. This is blood bank blood. Transfusion therapy is the safest it has ever been in the practice of medicine. Before blood is taken from a donor, a complete history is taken to make sure that person has no history of diseases nor engages in risky social behavior (examples are intravenous drug use or sexual activity with multiple partners). The donor's travel history is screened to minimize risk of transmitting infections, such as malaria. The donated blood is tested for signs of infectious diseases, such as HIV and hepatitis. The blood is then tested to be sure it is compatible with you in order to minimize the chance of a transfusion reaction. If you or a relative donates blood, this is often done in anticipation of surgery and is not appropriate for emergency situations. It takes many days to process the donated  blood. RISKS AND COMPLICATIONS Although transfusion therapy is very safe and saves many lives, the main dangers of transfusion include:   Getting an infectious disease.  Developing a transfusion reaction. This is an allergic reaction to something in the blood you were given. Every precaution is taken to prevent this. The decision to have a blood transfusion has been considered carefully by your caregiver before blood is given. Blood is not given unless the benefits outweigh the risks. AFTER THE  TRANSFUSION  Right after receiving a blood transfusion, you will usually feel much better and more energetic. This is especially true if your red blood cells have gotten low (anemic). The transfusion raises the level of the red blood cells which carry oxygen, and this usually causes an energy increase.  The nurse administering the transfusion will monitor you carefully for complications. HOME CARE INSTRUCTIONS  No special instructions are needed after a transfusion. You may find your energy is better. Speak with your caregiver about any limitations on activity for underlying diseases you may have. SEEK MEDICAL CARE IF:   Your condition is not improving after your transfusion.  You develop redness or irritation at the intravenous (IV) site. SEEK IMMEDIATE MEDICAL CARE IF:  Any of the following symptoms occur over the next 12 hours:  Shaking chills.  You have a temperature by mouth above 102 F (38.9 C), not controlled by medicine.  Chest, back, or muscle pain.  People around you feel you are not acting correctly or are confused.  Shortness of breath or difficulty breathing.  Dizziness and fainting.  You get a rash or develop hives.  You have a decrease in urine output.  Your urine turns a dark color or changes to pink, red, or brown. Any of the following symptoms occur over the next 10 days:  You have a temperature by mouth above 102 F (38.9 C), not controlled by  medicine.  Shortness of breath.  Weakness after normal activity.  The white part of the eye turns yellow (jaundice).  You have a decrease in the amount of urine or are urinating less often.  Your urine turns a dark color or changes to pink, red, or brown. Document Released: 04/11/2000 Document Revised: 07/07/2011 Document Reviewed: 11/29/2007 [facility] Patient Information 2014 [place], [place].  _______________________________________________________________________

## 2016-05-21 ENCOUNTER — Telehealth (HOSPITAL_COMMUNITY): Payer: Self-pay | Admitting: *Deleted

## 2016-05-21 NOTE — Telephone Encounter (Signed)
Patient given detailed instructions per Myocardial Perfusion Study Information Sheet for the test on 05/23/16. Patient notified to arrive 15 minutes early and that it is imperative to arrive on time for appointment to keep from having the test rescheduled.  If you need to cancel or reschedule your appointment, please call the office within 24 hours of your appointment. Failure to do so may result in a cancellation of your appointment, and a $50 no show fee. Patient verbalized understanding. Kirstie Peri

## 2016-05-22 ENCOUNTER — Encounter (HOSPITAL_COMMUNITY): Payer: Self-pay

## 2016-05-22 DIAGNOSIS — E785 Hyperlipidemia, unspecified: Secondary | ICD-10-CM | POA: Diagnosis not present

## 2016-05-22 DIAGNOSIS — Z01812 Encounter for preprocedural laboratory examination: Secondary | ICD-10-CM | POA: Insufficient documentation

## 2016-05-22 DIAGNOSIS — Z853 Personal history of malignant neoplasm of breast: Secondary | ICD-10-CM | POA: Diagnosis not present

## 2016-05-22 DIAGNOSIS — I071 Rheumatic tricuspid insufficiency: Secondary | ICD-10-CM | POA: Diagnosis not present

## 2016-05-22 DIAGNOSIS — I10 Essential (primary) hypertension: Secondary | ICD-10-CM | POA: Diagnosis not present

## 2016-05-22 DIAGNOSIS — Z8249 Family history of ischemic heart disease and other diseases of the circulatory system: Secondary | ICD-10-CM | POA: Diagnosis not present

## 2016-05-22 DIAGNOSIS — Z01810 Encounter for preprocedural cardiovascular examination: Secondary | ICD-10-CM | POA: Diagnosis not present

## 2016-05-22 DIAGNOSIS — R9431 Abnormal electrocardiogram [ECG] [EKG]: Secondary | ICD-10-CM | POA: Diagnosis not present

## 2016-05-22 LAB — BASIC METABOLIC PANEL
ANION GAP: 8 (ref 5–15)
BUN: 16 mg/dL (ref 6–20)
CALCIUM: 9.1 mg/dL (ref 8.9–10.3)
CO2: 30 mmol/L (ref 22–32)
Chloride: 102 mmol/L (ref 101–111)
Creatinine, Ser: 0.81 mg/dL (ref 0.44–1.00)
Glucose, Bld: 111 mg/dL — ABNORMAL HIGH (ref 65–99)
Potassium: 3.9 mmol/L (ref 3.5–5.1)
SODIUM: 140 mmol/L (ref 135–145)

## 2016-05-22 LAB — CBC
HCT: 43 % (ref 36.0–46.0)
HEMOGLOBIN: 13.8 g/dL (ref 12.0–15.0)
MCH: 27.2 pg (ref 26.0–34.0)
MCHC: 32.1 g/dL (ref 30.0–36.0)
MCV: 84.6 fL (ref 78.0–100.0)
Platelets: 217 10*3/uL (ref 150–400)
RBC: 5.08 MIL/uL (ref 3.87–5.11)
RDW: 13.8 % (ref 11.5–15.5)
WBC: 9.8 10*3/uL (ref 4.0–10.5)

## 2016-05-22 LAB — SURGICAL PCR SCREEN
MRSA, PCR: NEGATIVE
Staphylococcus aureus: NEGATIVE

## 2016-05-22 LAB — ABO/RH: ABO/RH(D): A NEG

## 2016-05-22 NOTE — Progress Notes (Signed)
Medical clearance per Dr  Redmond School  04-22-16 ekg 04-23-16

## 2016-05-23 ENCOUNTER — Encounter (HOSPITAL_COMMUNITY)
Admission: RE | Admit: 2016-05-23 | Discharge: 2016-05-23 | Disposition: A | Discharge status: Home / Self Care | Payer: Medicare HMO | Source: Ambulatory Visit | Attending: Orthopedic Surgery | Admitting: Orthopedic Surgery

## 2016-05-23 ENCOUNTER — Ambulatory Visit (HOSPITAL_BASED_OUTPATIENT_CLINIC_OR_DEPARTMENT_OTHER): Payer: Medicare HMO

## 2016-05-23 ENCOUNTER — Encounter (INDEPENDENT_AMBULATORY_CARE_PROVIDER_SITE_OTHER): Payer: Self-pay

## 2016-05-23 ENCOUNTER — Other Ambulatory Visit: Payer: Self-pay

## 2016-05-23 DIAGNOSIS — Z853 Personal history of malignant neoplasm of breast: Secondary | ICD-10-CM | POA: Diagnosis not present

## 2016-05-23 DIAGNOSIS — E785 Hyperlipidemia, unspecified: Secondary | ICD-10-CM | POA: Insufficient documentation

## 2016-05-23 DIAGNOSIS — Z8249 Family history of ischemic heart disease and other diseases of the circulatory system: Secondary | ICD-10-CM

## 2016-05-23 DIAGNOSIS — I10 Essential (primary) hypertension: Secondary | ICD-10-CM | POA: Insufficient documentation

## 2016-05-23 DIAGNOSIS — Z01810 Encounter for preprocedural cardiovascular examination: Secondary | ICD-10-CM | POA: Insufficient documentation

## 2016-05-23 DIAGNOSIS — I071 Rheumatic tricuspid insufficiency: Secondary | ICD-10-CM | POA: Diagnosis not present

## 2016-05-23 DIAGNOSIS — Z01818 Encounter for other preprocedural examination: Secondary | ICD-10-CM | POA: Diagnosis not present

## 2016-05-23 DIAGNOSIS — R9431 Abnormal electrocardiogram [ECG] [EKG]: Secondary | ICD-10-CM

## 2016-05-23 LAB — MYOCARDIAL PERFUSION IMAGING
CHL CUP NUCLEAR SRS: 2
CHL CUP NUCLEAR SSS: 2
CSEPPHR: 92 {beats}/min
LV dias vol: 92 mL (ref 46–106)
LV sys vol: 26 mL
RATE: 0.29
Rest HR: 71 {beats}/min
SDS: 0
TID: 1.1

## 2016-05-23 MED ORDER — TECHNETIUM TC 99M TETROFOSMIN IV KIT
10.5000 | PACK | Freq: Once | INTRAVENOUS | Status: AC | PRN
Start: 2016-05-23 — End: 2016-05-23
  Administered 2016-05-23: 10.5 via INTRAVENOUS
  Filled 2016-05-23: qty 11

## 2016-05-23 MED ORDER — TECHNETIUM TC 99M TETROFOSMIN IV KIT
32.1000 | PACK | Freq: Once | INTRAVENOUS | Status: AC | PRN
  Administered 2016-05-23: 32.1 via INTRAVENOUS
  Filled 2016-05-23: qty 33

## 2016-05-23 MED ORDER — REGADENOSON 0.4 MG/5ML IV SOLN
0.4000 mg | Freq: Once | INTRAVENOUS | Status: AC
  Administered 2016-05-23: 0.4 mg via INTRAVENOUS

## 2016-05-26 ENCOUNTER — Other Ambulatory Visit (HOSPITAL_COMMUNITY): Payer: Self-pay | Admitting: Emergency Medicine

## 2016-05-26 ENCOUNTER — Encounter: Payer: Self-pay | Admitting: Cardiology

## 2016-05-26 NOTE — Progress Notes (Signed)
Stress test 05-23-16 epic ECHO 05-23-16 epic Cardiac clearance per Dr Radford Pax now that stress and ECHO have resulted 05-08-16

## 2016-05-26 NOTE — Telephone Encounter (Signed)
This encounter was created in error - please disregard.

## 2016-05-26 NOTE — Telephone Encounter (Signed)
Follow Up:    Pt had stress and echo test on 05-23-16. She have not received the results yet,pt is scheduled for knee surgery tomorrow.She needs these results to be cleared for surgery.

## 2016-05-27 ENCOUNTER — Ambulatory Visit (HOSPITAL_COMMUNITY): Payer: Medicare HMO | Admitting: Anesthesiology

## 2016-05-27 ENCOUNTER — Encounter (HOSPITAL_COMMUNITY)
Admission: RE | Disposition: A | Discharge status: Home / Self Care | Payer: Self-pay | Source: Ambulatory Visit | Attending: Orthopedic Surgery

## 2016-05-27 ENCOUNTER — Observation Stay (HOSPITAL_COMMUNITY)
Admission: RE | Admit: 2016-05-27 | Discharge: 2016-05-28 | Disposition: A | Discharge status: Home / Self Care | Payer: Medicare HMO | Source: Ambulatory Visit | Attending: Orthopedic Surgery | Admitting: Orthopedic Surgery

## 2016-05-27 ENCOUNTER — Encounter (HOSPITAL_COMMUNITY): Payer: Self-pay | Admitting: *Deleted

## 2016-05-27 DIAGNOSIS — G8918 Other acute postprocedural pain: Secondary | ICD-10-CM | POA: Diagnosis not present

## 2016-05-27 DIAGNOSIS — I10 Essential (primary) hypertension: Secondary | ICD-10-CM | POA: Insufficient documentation

## 2016-05-27 DIAGNOSIS — E669 Obesity, unspecified: Secondary | ICD-10-CM | POA: Diagnosis present

## 2016-05-27 DIAGNOSIS — Z853 Personal history of malignant neoplasm of breast: Secondary | ICD-10-CM | POA: Insufficient documentation

## 2016-05-27 DIAGNOSIS — Z79899 Other long term (current) drug therapy: Secondary | ICD-10-CM | POA: Insufficient documentation

## 2016-05-27 DIAGNOSIS — M1711 Unilateral primary osteoarthritis, right knee: Secondary | ICD-10-CM | POA: Diagnosis not present

## 2016-05-27 DIAGNOSIS — C50412 Malignant neoplasm of upper-outer quadrant of left female breast: Secondary | ICD-10-CM | POA: Diagnosis not present

## 2016-05-27 DIAGNOSIS — M1712 Unilateral primary osteoarthritis, left knee: Secondary | ICD-10-CM | POA: Diagnosis not present

## 2016-05-27 DIAGNOSIS — E785 Hyperlipidemia, unspecified: Secondary | ICD-10-CM | POA: Insufficient documentation

## 2016-05-27 DIAGNOSIS — Z96651 Presence of right artificial knee joint: Secondary | ICD-10-CM

## 2016-05-27 DIAGNOSIS — Z6833 Body mass index (BMI) 33.0-33.9, adult: Secondary | ICD-10-CM | POA: Insufficient documentation

## 2016-05-27 HISTORY — PX: TOTAL KNEE ARTHROPLASTY: SHX125

## 2016-05-27 LAB — TYPE AND SCREEN
ABO/RH(D): A NEG
Antibody Screen: NEGATIVE

## 2016-05-27 SURGERY — ARTHROPLASTY, KNEE, TOTAL
Anesthesia: Spinal | Site: Knee | Laterality: Right

## 2016-05-27 MED ORDER — HYDROMORPHONE HCL 2 MG/ML IJ SOLN
INTRAMUSCULAR | Status: AC
  Filled 2016-05-27: qty 1

## 2016-05-27 MED ORDER — PROPOFOL 500 MG/50ML IV EMUL
INTRAVENOUS | Status: DC | PRN
  Administered 2016-05-27: 75 ug/kg/min via INTRAVENOUS

## 2016-05-27 MED ORDER — BUPIVACAINE HCL (PF) 0.25 % IJ SOLN
INTRAMUSCULAR | Status: DC | PRN
  Administered 2016-05-27: 30 mL

## 2016-05-27 MED ORDER — SIMVASTATIN 20 MG PO TABS
20.0000 mg | ORAL_TABLET | Freq: Every day | ORAL | Status: DC
  Administered 2016-05-27: 23:00:00 20 mg via ORAL
  Filled 2016-05-27: qty 1

## 2016-05-27 MED ORDER — LACTATED RINGERS IV SOLN
INTRAVENOUS | Status: DC | PRN
  Administered 2016-05-27 (×2): via INTRAVENOUS

## 2016-05-27 MED ORDER — KETOROLAC TROMETHAMINE 30 MG/ML IJ SOLN
INTRAMUSCULAR | Status: AC
  Filled 2016-05-27: qty 1

## 2016-05-27 MED ORDER — BUPIVACAINE IN DEXTROSE 0.75-8.25 % IT SOLN
INTRATHECAL | Status: DC | PRN
  Administered 2016-05-27: 1.6 mL via INTRATHECAL

## 2016-05-27 MED ORDER — POLYETHYLENE GLYCOL 3350 17 G PO PACK: 17.0000 g | PACK | Freq: Two times a day (BID) | ORAL | 0 refills | Status: DC

## 2016-05-27 MED ORDER — DEXAMETHASONE SODIUM PHOSPHATE 10 MG/ML IJ SOLN
10.0000 mg | Freq: Once | INTRAMUSCULAR | Status: AC
  Administered 2016-05-28: 12:00:00 10 mg via INTRAVENOUS
  Filled 2016-05-27: qty 1

## 2016-05-27 MED ORDER — DIPHENHYDRAMINE HCL 25 MG PO CAPS: 25.0000 mg | ORAL_CAPSULE | Freq: Four times a day (QID) | ORAL | Status: DC | PRN

## 2016-05-27 MED ORDER — BUPIVACAINE HCL (PF) 0.25 % IJ SOLN
INTRAMUSCULAR | Status: AC
  Filled 2016-05-27: qty 30

## 2016-05-27 MED ORDER — HYDROMORPHONE HCL 1 MG/ML IJ SOLN
0.2500 mg | INTRAMUSCULAR | Status: DC | PRN
Start: 2016-05-27 — End: 2016-05-27
  Administered 2016-05-27 (×2): 0.5 mg via INTRAVENOUS

## 2016-05-27 MED ORDER — BISOPROLOL FUMARATE 10 MG PO TABS
10.0000 mg | ORAL_TABLET | ORAL | Status: AC
  Administered 2016-05-27: 10 mg via ORAL
  Filled 2016-05-27: qty 1

## 2016-05-27 MED ORDER — PROMETHAZINE HCL 25 MG/ML IJ SOLN: 6.2500 mg | INTRAMUSCULAR | Status: DC | PRN

## 2016-05-27 MED ORDER — DEXAMETHASONE SODIUM PHOSPHATE 10 MG/ML IJ SOLN
INTRAMUSCULAR | Status: AC
  Filled 2016-05-27: qty 1

## 2016-05-27 MED ORDER — ONDANSETRON HCL 4 MG PO TABS: 4.0000 mg | ORAL_TABLET | Freq: Four times a day (QID) | ORAL | Status: DC | PRN

## 2016-05-27 MED ORDER — MAGNESIUM CITRATE PO SOLN: 1.0000 | Freq: Once | ORAL | Status: DC | PRN

## 2016-05-27 MED ORDER — ROPIVACAINE HCL 7.5 MG/ML IJ SOLN
INTRAMUSCULAR | Status: AC
  Filled 2016-05-27: qty 20

## 2016-05-27 MED ORDER — FENTANYL CITRATE (PF) 100 MCG/2ML IJ SOLN
50.0000 ug | INTRAMUSCULAR | Status: DC | PRN
  Administered 2016-05-27: 100 ug via INTRAVENOUS

## 2016-05-27 MED ORDER — ONDANSETRON HCL 4 MG/2ML IJ SOLN
INTRAMUSCULAR | Status: DC | PRN
  Administered 2016-05-27: 4 mg via INTRAVENOUS

## 2016-05-27 MED ORDER — METOCLOPRAMIDE HCL 5 MG PO TABS
5.0000 mg | ORAL_TABLET | Freq: Three times a day (TID) | ORAL | Status: DC | PRN
Start: 2016-05-27 — End: 2016-05-28

## 2016-05-27 MED ORDER — KETOROLAC TROMETHAMINE 30 MG/ML IJ SOLN
INTRAMUSCULAR | Status: DC | PRN
  Administered 2016-05-27: 30 mg

## 2016-05-27 MED ORDER — DEXTROSE 5 % IV SOLN
500.0000 mg | Freq: Four times a day (QID) | INTRAVENOUS | Status: DC | PRN
  Administered 2016-05-27: 500 mg via INTRAVENOUS
  Filled 2016-05-27: qty 5
  Filled 2016-05-27: qty 550

## 2016-05-27 MED ORDER — PROPOFOL 10 MG/ML IV BOLUS
INTRAVENOUS | Status: AC
  Filled 2016-05-27: qty 40

## 2016-05-27 MED ORDER — BISOPROLOL-HYDROCHLOROTHIAZIDE 10-6.25 MG PO TABS
1.0000 | ORAL_TABLET | Freq: Every day | ORAL | Status: DC
  Administered 2016-05-27 – 2016-05-28 (×2): 1 via ORAL
  Filled 2016-05-27 (×2): qty 1

## 2016-05-27 MED ORDER — MEPERIDINE HCL 50 MG/ML IJ SOLN: 6.2500 mg | INTRAMUSCULAR | Status: DC | PRN

## 2016-05-27 MED ORDER — HYDROCODONE-ACETAMINOPHEN 7.5-325 MG PO TABS
1.0000 | ORAL_TABLET | ORAL | Status: DC
  Administered 2016-05-27: 21:00:00 1 via ORAL
  Administered 2016-05-28 (×4): 2 via ORAL
  Filled 2016-05-27 (×3): qty 2
  Filled 2016-05-27: qty 1
  Filled 2016-05-27 (×2): qty 2

## 2016-05-27 MED ORDER — CELECOXIB 200 MG PO CAPS: 200.0000 mg | ORAL_CAPSULE | Freq: Two times a day (BID) | ORAL | 0 refills | Status: DC

## 2016-05-27 MED ORDER — TRANEXAMIC ACID 1000 MG/10ML IV SOLN
1000.0000 mg | INTRAVENOUS | Status: AC
  Administered 2016-05-27: 1000 mg via INTRAVENOUS
  Filled 2016-05-27: qty 1100

## 2016-05-27 MED ORDER — LACTATED RINGERS IV SOLN: INTRAVENOUS | Status: DC

## 2016-05-27 MED ORDER — SODIUM CHLORIDE 0.9 % IJ SOLN
INTRAMUSCULAR | Status: DC | PRN
  Administered 2016-05-27: 30 mL

## 2016-05-27 MED ORDER — KETOROLAC TROMETHAMINE 30 MG/ML IJ SOLN: 30.0000 mg | Freq: Once | INTRAMUSCULAR | Status: DC

## 2016-05-27 MED ORDER — METOCLOPRAMIDE HCL 5 MG/ML IJ SOLN: 5.0000 mg | Freq: Three times a day (TID) | INTRAMUSCULAR | Status: DC | PRN

## 2016-05-27 MED ORDER — SODIUM CHLORIDE 0.9 % IJ SOLN
INTRAMUSCULAR | Status: AC
  Filled 2016-05-27: qty 50

## 2016-05-27 MED ORDER — HYDROMORPHONE HCL 1 MG/ML IJ SOLN: 0.5000 mg | INTRAMUSCULAR | Status: DC | PRN

## 2016-05-27 MED ORDER — CEFAZOLIN SODIUM-DEXTROSE 2-4 GM/100ML-% IV SOLN
2.0000 g | Freq: Four times a day (QID) | INTRAVENOUS | Status: AC
  Administered 2016-05-27 – 2016-05-28 (×2): 2 g via INTRAVENOUS
  Filled 2016-05-27 (×2): qty 100

## 2016-05-27 MED ORDER — DEXAMETHASONE SODIUM PHOSPHATE 10 MG/ML IJ SOLN
10.0000 mg | Freq: Once | INTRAMUSCULAR | Status: AC
  Administered 2016-05-27: 10 mg via INTRAVENOUS

## 2016-05-27 MED ORDER — ALUM & MAG HYDROXIDE-SIMETH 200-200-20 MG/5ML PO SUSP: 30.0000 mL | ORAL | Status: DC | PRN

## 2016-05-27 MED ORDER — ONDANSETRON HCL 4 MG/2ML IJ SOLN
4.0000 mg | Freq: Four times a day (QID) | INTRAMUSCULAR | Status: DC | PRN
  Administered 2016-05-28: 12:00:00 4 mg via INTRAVENOUS
  Filled 2016-05-27: qty 2

## 2016-05-27 MED ORDER — POLYETHYLENE GLYCOL 3350 17 G PO PACK
17.0000 g | PACK | Freq: Two times a day (BID) | ORAL | Status: DC
  Administered 2016-05-27 – 2016-05-28 (×2): 17 g via ORAL
  Filled 2016-05-27 (×2): qty 1

## 2016-05-27 MED ORDER — SODIUM CHLORIDE 0.9 % IV SOLN
INTRAVENOUS | Status: DC
  Administered 2016-05-27: 21:00:00 via INTRAVENOUS
  Filled 2016-05-27 (×3): qty 1000

## 2016-05-27 MED ORDER — SODIUM CHLORIDE 0.9 % IR SOLN
Status: DC | PRN
  Administered 2016-05-27: 1000 mL

## 2016-05-27 MED ORDER — HYDROCODONE-ACETAMINOPHEN 7.5-325 MG PO TABS: 1.0000 | ORAL_TABLET | ORAL | 0 refills | Status: DC

## 2016-05-27 MED ORDER — FERROUS SULFATE 325 (65 FE) MG PO TABS
325.0000 mg | ORAL_TABLET | Freq: Three times a day (TID) | ORAL | Status: DC
  Administered 2016-05-28: 09:00:00 325 mg via ORAL
  Filled 2016-05-27: qty 1

## 2016-05-27 MED ORDER — DOCUSATE SODIUM 100 MG PO CAPS: 100.0000 mg | ORAL_CAPSULE | Freq: Two times a day (BID) | ORAL | 0 refills | Status: DC

## 2016-05-27 MED ORDER — ONDANSETRON HCL 4 MG/2ML IJ SOLN
INTRAMUSCULAR | Status: AC
  Filled 2016-05-27: qty 2

## 2016-05-27 MED ORDER — MENTHOL 3 MG MT LOZG: 1.0000 | LOZENGE | OROMUCOSAL | Status: DC | PRN

## 2016-05-27 MED ORDER — DOCUSATE SODIUM 100 MG PO CAPS
100.0000 mg | ORAL_CAPSULE | Freq: Two times a day (BID) | ORAL | Status: DC
  Administered 2016-05-27 – 2016-05-28 (×2): 100 mg via ORAL
  Filled 2016-05-27 (×2): qty 1

## 2016-05-27 MED ORDER — FENTANYL CITRATE (PF) 100 MCG/2ML IJ SOLN
INTRAMUSCULAR | Status: AC
  Filled 2016-05-27: qty 2

## 2016-05-27 MED ORDER — CELECOXIB 200 MG PO CAPS
200.0000 mg | ORAL_CAPSULE | Freq: Two times a day (BID) | ORAL | Status: DC
  Administered 2016-05-27 – 2016-05-28 (×2): 200 mg via ORAL
  Filled 2016-05-27 (×2): qty 1

## 2016-05-27 MED ORDER — 0.9 % SODIUM CHLORIDE (POUR BTL) OPTIME
TOPICAL | Status: DC | PRN
  Administered 2016-05-27: 1000 mL

## 2016-05-27 MED ORDER — PHENOL 1.4 % MT LIQD
1.0000 | OROMUCOSAL | Status: DC | PRN
  Filled 2016-05-27: qty 177

## 2016-05-27 MED ORDER — CEFAZOLIN SODIUM-DEXTROSE 2-4 GM/100ML-% IV SOLN
INTRAVENOUS | Status: AC
  Filled 2016-05-27: qty 100

## 2016-05-27 MED ORDER — METHOCARBAMOL 500 MG PO TABS: 500.0000 mg | ORAL_TABLET | Freq: Four times a day (QID) | ORAL | Status: DC | PRN

## 2016-05-27 MED ORDER — AMLODIPINE BESYLATE 5 MG PO TABS
5.0000 mg | ORAL_TABLET | Freq: Every day | ORAL | Status: DC
  Filled 2016-05-27: qty 1

## 2016-05-27 MED ORDER — BISACODYL 10 MG RE SUPP: 10.0000 mg | Freq: Every day | RECTAL | Status: DC | PRN

## 2016-05-27 MED ORDER — MIDAZOLAM HCL 2 MG/2ML IJ SOLN
1.0000 mg | INTRAMUSCULAR | Status: DC | PRN
  Administered 2016-05-27: 2 mg via INTRAVENOUS

## 2016-05-27 MED ORDER — CEFAZOLIN SODIUM-DEXTROSE 2-4 GM/100ML-% IV SOLN
2.0000 g | INTRAVENOUS | Status: AC
  Administered 2016-05-27: 2 g via INTRAVENOUS

## 2016-05-27 MED ORDER — ASPIRIN 81 MG PO CHEW
81.0000 mg | CHEWABLE_TABLET | Freq: Two times a day (BID) | ORAL | Status: DC
  Administered 2016-05-27 – 2016-05-28 (×2): 81 mg via ORAL
  Filled 2016-05-27 (×2): qty 1

## 2016-05-27 MED ORDER — ASPIRIN 81 MG PO CHEW: 81.0000 mg | CHEWABLE_TABLET | Freq: Two times a day (BID) | ORAL | 0 refills | Status: DC

## 2016-05-27 MED ORDER — METHOCARBAMOL 500 MG PO TABS: 500.0000 mg | ORAL_TABLET | Freq: Four times a day (QID) | ORAL | 0 refills | Status: DC | PRN

## 2016-05-27 MED ORDER — MIDAZOLAM HCL 2 MG/2ML IJ SOLN
INTRAMUSCULAR | Status: AC
  Filled 2016-05-27: qty 2

## 2016-05-27 SURGICAL SUPPLY — 45 items
BAG DECANTER FOR FLEXI CONT (MISCELLANEOUS) IMPLANT
BAG ZIPLOCK 12X15 (MISCELLANEOUS) IMPLANT
BANDAGE ACE 6X5 VEL STRL LF (GAUZE/BANDAGES/DRESSINGS) ×2 IMPLANT
BLADE SAW SGTL 13.0X1.19X90.0M (BLADE) ×2 IMPLANT
BOWL SMART MIX CTS (DISPOSABLE) ×2 IMPLANT
CAPT KNEE TOTAL 3 ATTUNE ×2 IMPLANT
CEMENT HV SMART SET (Cement) ×4 IMPLANT
CLOTH BEACON ORANGE TIMEOUT ST (SAFETY) ×2 IMPLANT
CUFF TOURN SGL QUICK 34 (TOURNIQUET CUFF) ×1
CUFF TRNQT CYL 34X4X40X1 (TOURNIQUET CUFF) ×1 IMPLANT
DECANTER SPIKE VIAL GLASS SM (MISCELLANEOUS) ×2 IMPLANT
DERMABOND ADVANCED (GAUZE/BANDAGES/DRESSINGS) ×1
DERMABOND ADVANCED .7 DNX12 (GAUZE/BANDAGES/DRESSINGS) ×1 IMPLANT
DRAPE U-SHAPE 47X51 STRL (DRAPES) ×2 IMPLANT
DRESSING AQUACEL AG SP 3.5X10 (GAUZE/BANDAGES/DRESSINGS) ×1 IMPLANT
DRSG AQUACEL AG SP 3.5X10 (GAUZE/BANDAGES/DRESSINGS) ×2
DURAPREP 26ML APPLICATOR (WOUND CARE) ×4 IMPLANT
ELECT REM PT RETURN 9FT ADLT (ELECTROSURGICAL) ×2
ELECTRODE REM PT RTRN 9FT ADLT (ELECTROSURGICAL) ×1 IMPLANT
GLOVE BIOGEL M 7.0 STRL (GLOVE) ×2 IMPLANT
GLOVE BIOGEL PI IND STRL 7.5 (GLOVE) ×2 IMPLANT
GLOVE BIOGEL PI IND STRL 8.5 (GLOVE) IMPLANT
GLOVE BIOGEL PI INDICATOR 7.5 (GLOVE) ×2
GLOVE BIOGEL PI INDICATOR 8.5 (GLOVE)
GLOVE ECLIPSE 8.0 STRL XLNG CF (GLOVE) IMPLANT
GLOVE ORTHO TXT STRL SZ7.5 (GLOVE) ×4 IMPLANT
GOWN STRL REUS W/TWL LRG LVL3 (GOWN DISPOSABLE) ×2 IMPLANT
GOWN STRL REUS W/TWL XL LVL3 (GOWN DISPOSABLE) ×2 IMPLANT
HANDPIECE INTERPULSE COAX TIP (DISPOSABLE) ×1
LIQUID BAND (GAUZE/BANDAGES/DRESSINGS) ×2 IMPLANT
MANIFOLD NEPTUNE II (INSTRUMENTS) ×2 IMPLANT
PACK TOTAL KNEE CUSTOM (KITS) ×2 IMPLANT
POSITIONER SURGICAL ARM (MISCELLANEOUS) ×2 IMPLANT
SET HNDPC FAN SPRY TIP SCT (DISPOSABLE) ×1 IMPLANT
SET PAD KNEE POSITIONER (MISCELLANEOUS) ×2 IMPLANT
SUT MNCRL AB 4-0 PS2 18 (SUTURE) ×2 IMPLANT
SUT VIC AB 1 CT1 36 (SUTURE) ×2 IMPLANT
SUT VIC AB 2-0 CT1 27 (SUTURE) ×3
SUT VIC AB 2-0 CT1 TAPERPNT 27 (SUTURE) ×3 IMPLANT
SUT VLOC 180 0 24IN GS25 (SUTURE) ×2 IMPLANT
SYR 50ML LL SCALE MARK (SYRINGE) IMPLANT
TRAY FOLEY W/METER SILVER 16FR (SET/KITS/TRAYS/PACK) ×2 IMPLANT
WATER STERILE IRR 1500ML POUR (IV SOLUTION) ×2 IMPLANT
WRAP KNEE MAXI GEL POST OP (GAUZE/BANDAGES/DRESSINGS) ×2 IMPLANT
YANKAUER SUCT BULB TIP 10FT TU (MISCELLANEOUS) ×2 IMPLANT

## 2016-05-27 NOTE — Anesthesia Procedure Notes (Signed)
Anesthesia Procedure Image    

## 2016-05-27 NOTE — Interval H&P Note (Signed)
History and Physical Interval Note:  05/27/2016 1:43 PM  Alexandra Bowen  has presented today for surgery, with the diagnosis of Right knee osteoarthritis   The various methods of treatment have been discussed with the patient and family. After consideration of risks, benefits and other options for treatment, the patient has consented to  Procedure(s): RIGHT TOTAL KNEE ARTHROPLASTY (Right) as a surgical intervention .  The patient's history has been reviewed, patient examined, no change in status, stable for surgery.  I have reviewed the patient's chart and labs.  Questions were answered to the patient's satisfaction.     Mauri Pole

## 2016-05-27 NOTE — Anesthesia Procedure Notes (Signed)
Spinal  Patient location during procedure: OR End time: 05/27/2016 2:44 PM Staffing Resident/CRNA: Noralyn Pick D Performed: anesthesiologist and resident/CRNA  Preanesthetic Checklist Completed: patient identified, site marked, surgical consent, pre-op evaluation, timeout performed, IV checked, risks and benefits discussed and monitors and equipment checked Spinal Block Patient position: sitting Prep: Betadine Patient monitoring: heart rate, continuous pulse ox and blood pressure Approach: right paramedian Location: L3-4 Injection technique: single-shot Needle Needle type: Sprotte  Needle gauge: 24 G Needle length: 9 cm Additional Notes Expiration date of kit checked and confirmed. Patient tolerated procedure well, without complications.

## 2016-05-27 NOTE — Discharge Instructions (Signed)

## 2016-05-27 NOTE — Anesthesia Postprocedure Evaluation (Addendum)
Anesthesia Post Note  Patient: Alexandra Bowen  Procedure(s) Performed: Procedure(s) (LRB): RIGHT TOTAL KNEE ARTHROPLASTY (Right)  Patient location during evaluation: PACU Anesthesia Type: Spinal Level of consciousness: awake Pain management: pain level controlled Vital Signs Assessment: post-procedure vital signs reviewed and stable Respiratory status: spontaneous breathing Cardiovascular status: stable Postop Assessment: no headache, no backache, spinal receding, patient able to bend at knees and no signs of nausea or vomiting Anesthetic complications: no        Last Vitals:  Vitals:   05/27/16 1745 05/27/16 1751  BP: 135/69 (!) 133/59  Pulse: 64   Resp: 10 12  Temp:  36.4 C    Last Pain:  Vitals:   05/27/16 1751  TempSrc:   PainSc: 2    Pain Goal: Patients Stated Pain Goal: 4 (05/27/16 1434)  LLE Motor Response: Purposeful movement (05/27/16 1751) LLE Sensation: Numbness (05/27/16 1751) RLE Motor Response: Purposeful movement (05/27/16 1751) RLE Sensation: Numbness (05/27/16 1751) L Sensory Level: S1-Sole of foot, small toes (05/27/16 1751) R Sensory Level: S1-Sole of foot, small toes (05/27/16 1751)  Susy Placzek JR,JOHN Mateo Flow

## 2016-05-27 NOTE — Anesthesia Procedure Notes (Signed)
Anesthesia Regional Block:  Adductor canal block  Pre-Anesthetic Checklist: ,, timeout performed, Correct Patient, Correct Site, Correct Laterality, Correct Procedure, Correct Position, site marked, Risks and benefits discussed,  Surgical consent,  Pre-op evaluation,  At surgeon's request and post-op pain management  Laterality: Right  Prep: chloraprep       Needles:  Injection technique: Single-shot  Needle Type: Echogenic Stimulator Needle     Needle Length: 9cm 9 cm Needle Gauge: 20 and 20 G  Needle insertion depth: 5 cm   Additional Needles:  Procedures: ultrasound guided (picture in chart) Adductor canal block Narrative:  Injection made incrementally with aspirations every 5 mL. Anesthesiologist: Lyn Hollingshead  Additional Notes: Patient tolerated the procedure well. There were no complications

## 2016-05-27 NOTE — Op Note (Signed)
NAME:  Alexandra Bowen                      MEDICAL RECORD NO.:  [id number]                             FACILITY:  [facility]      PHYSICIAN:  Pietro Cassis. Alvan Dame, M.D.  DATE OF BIRTH:  Sep 13, 1935      DATE OF PROCEDURE:  05/27/2016                                     OPERATIVE REPORT         PREOPERATIVE DIAGNOSIS:  Right knee osteoarthritis.      POSTOPERATIVE DIAGNOSIS:  Right knee osteoarthritis.      FINDINGS:  The patient was noted to have complete loss of cartilage and   bone-on-bone arthritis with associated osteophytes in the lateral and patellofemoral compartments of   the knee with a significant synovitis and associated effusion.      PROCEDURE:  Right total knee replacement.      COMPONENTS USED:  DePuy Attune rotating platform posterior stabilized knee   system, a size 6N femur, 5 tibia, size 6 PS AOX insert, and 35 anatomic patellar   button.      SURGEON:  Pietro Cassis. Alvan Dame, M.D.      ASSISTANT:  Nehemiah Massed, PA-C.      ANESTHESIA:  Regional and Spinal.      SPECIMENS:  None.      COMPLICATION:  None.      DRAINS:  None.  EBL: <150cc      TOURNIQUET TIME:   28 min at 250 mmHg      The patient was stable to the recovery room.      INDICATION FOR PROCEDURE:  Alexandra Bowen is a 81 y.o. female patient of   mine.  The patient had been seen, evaluated, and treated conservatively in the   office with medication, activity modification, and injections.  The patient had   radiographic changes of bone-on-bone arthritis with endplate sclerosis and osteophytes noted.      The patient failed conservative measures including medication, injections, and activity modification, and at this point was ready for more definitive measures.   Based on the radiographic changes and failed conservative measures, the patient   decided to proceed with total knee replacement.  Risks of infection,   DVT, component failure, need for revision surgery, postop course, and   expectations were  all   discussed and reviewed.  Consent was obtained for benefit of pain   relief.      PROCEDURE IN DETAIL:  The patient was brought to the operative theater.   Once adequate anesthesia, preoperative antibiotics, 2 gm of Ancef, 1 gm of Tranexamic ACid, and 10 mg of Decadron administered, the patient was positioned supine with the right thigh tourniquet placed.  The  right lower extremity was prepped and draped in sterile fashion.  A time-   out was performed identifying the patient, planned procedure, and   extremity.      The left lower extremity was placed in the [facility] leg holder.  The leg was   exsanguinated, tourniquet elevated to 250 mmHg.  A midline incision was   made followed by median parapatellar arthrotomy.  Following initial   exposure,  attention was first directed to the patella.  Precut   measurement was noted to be 24 mm.  I resected down to 24 mm and used a   35 anatomic patellar button to restore patellar height as well as cover the cut   surface.      The lug holes were drilled and a metal shim was placed to protect the   patella from retractors and saw blades.      At this point, attention was now directed to the femur.  The femoral   canal was opened with a drill, irrigated to try to prevent fat emboli.  An   intramedullary rod was passed at 3 degrees valgus, 9 mm of bone was   resected off the distal femur.  Following this resection, the tibia was   subluxated anteriorly.  Using the extramedullary guide, 2 mm of bone was resected off   the proximal lateral tibia.  We confirmed the gap would be   stable medially and laterally with a size 5 spacer block as well as confirmed   the cut was perpendicular in the coronal plane, checking with an alignment rod.      Once this was done, I sized the femur to be a size 6 in the anterior-   posterior dimension, chose a narrow component based on medial and   lateral dimension.  The size 6 rotation block was then pinned in    position anterior referenced using the C-clamp to set rotation.  The   anterior, posterior, and  chamfer cuts were made without difficulty nor   notching making certain that I was along the anterior cortex to help   with flexion gap stability.      The final box cut was made off the lateral aspect of distal femur.      At this point, the tibia was sized to be a size 5, the size 5 tray was   then pinned in position through the medial third of the tubercle,   drilled, and keel punched.  Trial reduction was now carried with a 6 femur,  5 tibia, a size 6 PS insert, and the 35 anatomic patella botton.  The knee was brought to   extension, full extension with good flexion stability with the patella   tracking through the trochlea without application of pressure.  Given   all these findings the femoral lug holes were drilled and then the trial components removed.  Final components were   opened and cement was mixed.  The knee was irrigated with normal saline   solution and pulse lavage.  The synovial lining was   then injected with 30 cc of 0.25% Marcaine with epinephrine and 1 cc of Toradol plus 30 cc of NS for a total of 61 cc.      The knee was irrigated.  Final implants were then cemented onto clean and   dried cut surfaces of bone with the knee brought to extension with a size 6 PS trial insert.      Once the cement had fully cured, the excess cement was removed   throughout the knee.  I confirmed I was satisfied with the range of   motion and stability, and the final size 6 PS AOX insert was chosen.  It was   placed into the knee.      The tourniquet had been let down at 28 minutes.  No significant   hemostasis required.  The   extensor mechanism  was then reapproximated using #1 Vicryl and #1 Stratafix sutures with the knee   in flexion.  The   remaining wound was closed with 2-0 Vicryl and running 4-0 Monocryl.   The knee was cleaned, dried, dressed sterilely using Dermabond and    Aquacel dressing.  The patient was then   brought to recovery room in stable condition, tolerating the procedure   well.   Please note that Physician Assistant, Nehemiah Massed, PA-C, was present for the entirety of the case, and was utilized for pre-operative positioning, peri-operative retractor management, general facilitation of the procedure.  He was also utilized for primary wound closure at the end of the case.              Pietro Cassis Alvan Dame, M.D.    05/27/2016 1:52 PM

## 2016-05-27 NOTE — Anesthesia Preprocedure Evaluation (Signed)
Anesthesia Evaluation  Patient identified by MRN, date of birth, ID band Patient awake    Reviewed: Allergy & Precautions, NPO status , Patient's Chart, lab work & pertinent test results  Airway Mallampati: I       Dental no notable dental hx.    Pulmonary neg pulmonary ROS,    Pulmonary exam normal breath sounds clear to auscultation       Cardiovascular hypertension, Pt. on medications and Pt. on home beta blockers Normal cardiovascular exam Rhythm:Regular Rate:Normal     Neuro/Psych negative neurological ROS  negative psych ROS   GI/Hepatic negative GI ROS, Neg liver ROS,   Endo/Other  negative endocrine ROS  Renal/GU   negative genitourinary   Musculoskeletal   Abdominal (+) + obese,   Peds negative pediatric ROS (+)  Hematology negative hematology ROS (+)   Anesthesia Other Findings   Reproductive/Obstetrics negative OB ROS                             Anesthesia Physical Anesthesia Plan  ASA: II  Anesthesia Plan: Spinal   Post-op Pain Management:  Regional for Post-op pain   Induction:   Airway Management Planned:   Additional Equipment:   Intra-op Plan:   Post-operative Plan:   Informed Consent: I have reviewed the patients History and Physical, chart, labs and discussed the procedure including the risks, benefits and alternatives for the proposed anesthesia with the patient or authorized representative who has indicated his/her understanding and acceptance.     Plan Discussed with: CRNA and Surgeon  Anesthesia Plan Comments:         Anesthesia Quick Evaluation

## 2016-05-27 NOTE — Transfer of Care (Signed)
Immediate Anesthesia Transfer of Care Note  Patient: Alexandra Bowen  Procedure(s) Performed: Procedure(s): RIGHT TOTAL KNEE ARTHROPLASTY (Right)  Patient Location: PACU  Anesthesia Type:Regional and Spinal  Level of Consciousness: awake, alert  and oriented  Airway & Oxygen Therapy: Patient Spontanous Breathing and Patient connected to nasal cannula oxygen  Post-op Assessment: Report given to RN and Post -op Vital signs reviewed and stable  Post vital signs: Reviewed and stable  Last Vitals:  Vitals:   05/27/16 1429 05/27/16 1430  BP:    Pulse:    Resp: 17 17  Temp:      Last Pain:  Vitals:   05/27/16 1239  TempSrc: Oral      Patients Stated Pain Goal: 4 ([id number])  Complications: No apparent anesthesia complications

## 2016-05-28 ENCOUNTER — Encounter (HOSPITAL_COMMUNITY): Payer: Self-pay | Admitting: Orthopedic Surgery

## 2016-05-28 DIAGNOSIS — Z79899 Other long term (current) drug therapy: Secondary | ICD-10-CM | POA: Diagnosis not present

## 2016-05-28 DIAGNOSIS — Z96651 Presence of right artificial knee joint: Secondary | ICD-10-CM | POA: Diagnosis not present

## 2016-05-28 DIAGNOSIS — M1711 Unilateral primary osteoarthritis, right knee: Secondary | ICD-10-CM | POA: Diagnosis not present

## 2016-05-28 DIAGNOSIS — E785 Hyperlipidemia, unspecified: Secondary | ICD-10-CM | POA: Diagnosis not present

## 2016-05-28 DIAGNOSIS — Z6833 Body mass index (BMI) 33.0-33.9, adult: Secondary | ICD-10-CM | POA: Diagnosis not present

## 2016-05-28 DIAGNOSIS — I10 Essential (primary) hypertension: Secondary | ICD-10-CM | POA: Diagnosis not present

## 2016-05-28 DIAGNOSIS — E669 Obesity, unspecified: Secondary | ICD-10-CM | POA: Diagnosis not present

## 2016-05-28 DIAGNOSIS — Z853 Personal history of malignant neoplasm of breast: Secondary | ICD-10-CM | POA: Diagnosis not present

## 2016-05-28 LAB — CBC
HCT: 36.5 % (ref 36.0–46.0)
HEMOGLOBIN: 11.7 g/dL — AB (ref 12.0–15.0)
MCH: 26.8 pg (ref 26.0–34.0)
MCHC: 32.1 g/dL (ref 30.0–36.0)
MCV: 83.7 fL (ref 78.0–100.0)
PLATELETS: 196 10*3/uL (ref 150–400)
RBC: 4.36 MIL/uL (ref 3.87–5.11)
RDW: 13.8 % (ref 11.5–15.5)
WBC: 11.1 10*3/uL — AB (ref 4.0–10.5)

## 2016-05-28 LAB — BASIC METABOLIC PANEL
Anion gap: 7 (ref 5–15)
BUN: 13 mg/dL (ref 6–20)
CHLORIDE: 106 mmol/L (ref 101–111)
CO2: 29 mmol/L (ref 22–32)
CREATININE: 0.75 mg/dL (ref 0.44–1.00)
Calcium: 8.8 mg/dL — ABNORMAL LOW (ref 8.9–10.3)
GFR calc Af Amer: >60 mL/min (ref >60)
GFR calc non Af Amer: >60 mL/min (ref >60)
Glucose, Bld: 156 mg/dL — ABNORMAL HIGH (ref 65–99)
POTASSIUM: 3.9 mmol/L (ref 3.5–5.1)
SODIUM: 142 mmol/L (ref 135–145)

## 2016-05-28 NOTE — Addendum Note (Signed)
Addendum  created 05/28/16 1203 by Lollie Sails, CRNA   Charge Capture section accepted

## 2016-05-28 NOTE — Progress Notes (Signed)
     Subjective: 1 Day Post-Op Procedure(s) (LRB): RIGHT TOTAL KNEE ARTHROPLASTY (Right)   Patient reports pain as mild, pain controlled. No events throughout the night. Ready to be discharged home if she does well with PT.   Objective:   VITALS:   Vitals:   05/28/16 0148 05/28/16 0534  BP: (!) 118/49 (!) 128/55  Pulse: (!) 56 (!) 58  Resp: 16 16  Temp: 97.6 F (36.4 C) 97.5 F (36.4 C)    Dorsiflexion/Plantar flexion intact Incision: dressing C/D/I No cellulitis present Compartment soft  LABS  Recent Labs  05/28/16 0432  HGB 11.7*  HCT 36.5  WBC 11.1*  PLT 196     Recent Labs  05/28/16 0432  NA 142  K 3.9  BUN 13  CREATININE 0.75  GLUCOSE 156*     Assessment/Plan: 1 Day Post-Op Procedure(s) (LRB): RIGHT TOTAL KNEE ARTHROPLASTY (Right) Foley cath d/c'ed Advance diet Up with therapy D/C IV fluids Discharge home Follow up in 2 weeks at [facility]. Follow up with OLIN,Lainee Lehrman D in 2 weeks.  Contact information:  [facility] [address] [place] [id number]    Obese (BMI 30-39.9) Estimated body mass index is 33.78 kg/m as calculated from the following:   Height as of this encounter: 5\' 5"  (1.651 m).   Weight as of this encounter: 92.1 kg (203 lb). Patient also counseled that weight may inhibit the healing process Patient counseled that losing weight will help with future health issues     [place]. Denea Cheaney   PAC  05/28/2016, 8:32 AM

## 2016-05-28 NOTE — Progress Notes (Signed)
OT Cancellation Note  Patient Details Name: PEARLEAN SOLAND MRN: [id number] DOB: 11-14-1935   Cancelled Treatment:    Reason Eval/Treat Not Completed: Other (comment). Pt is in bed eating. Will return later.   Kamren Heskett 05/28/2016, 11:55 AM  Lesle Chris, OTR/L [phone] 05/28/2016

## 2016-05-28 NOTE — Progress Notes (Signed)
Physical Therapy Treatment Patient Details Name: RAYETTA TALLUTO MRN: [id number] DOB: 1936-03-21 Today's Date: 05/28/2016    History of Present Illness Pt s/p R TKR and hx of breast CA    PT Comments    Pt progressing well with mobility and eager for dc home.  Reviewed car transfers and stairs with written instruction provided  Follow Up Recommendations  Home health PT     Equipment Recommendations  Rolling walker with 5" wheels    Recommendations for Other Services OT consult     Precautions / Restrictions Precautions Precautions: Knee Restrictions Weight Bearing Restrictions: No Other Position/Activity Restrictions: WBAT    Mobility  Bed Mobility Overal bed mobility: Needs Assistance Bed Mobility: Sit to Supine       Sit to supine: Supervision   General bed mobility comments: oob  Transfers Overall transfer level: Needs assistance Equipment used: Rolling walker (2 wheeled) Transfers: Sit to/from Stand Sit to Stand: Min guard         General transfer comment: cues for UE placement  Ambulation/Gait Ambulation/Gait assistance: Min guard;Supervision Ambulation Distance (Feet): 100 Feet Assistive device: Rolling walker (2 wheeled) Gait Pattern/deviations: Step-to pattern;Decreased step length - right;Decreased step length - left;Shuffle;Trunk flexed Gait velocity: decr Gait velocity interpretation: Below normal speed for age/gender General Gait Details: cues for sequence, posture and position from RW   Stairs Stairs: Yes   Stair Management: No rails;Step to pattern;Forwards;With walker Number of Stairs: 3 General stair comments: single step 3x with RW and cues for sequence and foot/placement  Wheelchair Mobility    Modified Rankin (Stroke Patients Only)       Balance Overall balance assessment: No apparent balance deficits (not formally assessed)                                  Cognition Arousal/Alertness:  Awake/alert Behavior During Therapy: WFL for tasks assessed/performed Overall Cognitive Status: Within Functional Limits for tasks assessed                      Exercises Total Joint Exercises Ankle Circles/Pumps: AROM;Both;Supine;15 reps    General Comments        Pertinent Vitals/Pain Pain Assessment: 0-10 Pain Score: 4  Pain Location: R knee Pain Descriptors / Indicators: Aching;Sore Pain Intervention(s): Limited activity within patient's tolerance;Monitored during session;Premedicated before session;Ice applied    Home Living Family/patient expects to be discharged to:: Private residence Living Arrangements: Other relatives Available Help at Discharge: Family Type of Home: House       Home Equipment: None Additional Comments: 3:1 delivered to room    Prior Function Level of Independence: Independent          PT Goals (current goals can now be found in the care plan section) Acute Rehab PT Goals Patient Stated Goal: Regain IND PT Goal Formulation: With patient Time For Goal Achievement: 05/31/16 Potential to Achieve Goals: Good Progress towards PT goals: Progressing toward goals    Frequency    7X/week      PT Plan Current plan remains appropriate    Co-evaluation             End of Session Equipment Utilized During Treatment: Gait belt Activity Tolerance: Patient tolerated treatment well Patient left: in bed;with call bell/phone within reach     Time: [id number] PT Time Calculation (min) (ACUTE ONLY): 24 min  Charges:  $Gait Training: 8-22 mins $Therapeutic Activity: 8-22  mins                    G Codes:      Donavon Kimrey 06/08/2016, 4:38 PM

## 2016-05-28 NOTE — Evaluation (Signed)
Occupational Therapy Evaluation Patient Details Name: Alexandra Bowen MRN: [id number] DOB: 02-24-1936 Today's Date: 05/28/2016    History of Present Illness Pt s/p R TKR and hx of breast CA   Clinical Impression   This 81 year old female was admitted for the above sx. All education was completed. No further OT is needed at this time    Follow Up Recommendations  No OT follow up;Supervision/Assistance - 24 hour    Equipment Recommendations  3 in 1 bedside commode (delivered)    Recommendations for Other Services       Precautions / Restrictions Precautions Precautions: Knee Restrictions Other Position/Activity Restrictions: WBAT      Mobility Bed Mobility               General bed mobility comments: oob  Transfers   Equipment used: Rolling walker (2 wheeled)   Sit to Stand: Min guard         General transfer comment: cues for UE placement    Balance                                            ADL Overall ADL's : Needs assistance/impaired     Grooming: Supervision/safety;Standing       Lower Body Bathing: Minimal assistance;Sit to/from stand       Lower Body Dressing: Moderate assistance;Sit to/from stand   Toilet Transfer: Min guard;Ambulation;BSC;RW   Toileting- Water quality scientist and Hygiene: Min guard;Sit to/from stand   Tub/ Shower Transfer: Walk-in shower;Min guard;3 in 1;Ambulation     General ADL Comments: ambulated to bathroom and performed adl, toilet and shower transfers.       Vision     Perception     Praxis      Pertinent Vitals/Pain Pain Score: 4  Pain Location: R knee Pain Descriptors / Indicators: Aching;Sore Pain Intervention(s): Limited activity within patient's tolerance;Monitored during session;Premedicated before session;Repositioned     Hand Dominance     Extremity/Trunk Assessment Upper Extremity Assessment Upper Extremity Assessment: Overall WFL for tasks assessed            Communication Communication Communication: No difficulties   Cognition Arousal/Alertness: Awake/alert Behavior During Therapy: WFL for tasks assessed/performed Overall Cognitive Status: Within Functional Limits for tasks assessed                     General Comments       Exercises       Shoulder Instructions      Home Living Family/patient expects to be discharged to:: Private residence Living Arrangements: Other relatives Available Help at Discharge: Family Type of Home: House             Bathroom Shower/Tub: Occupational psychologist: Standard     Home Equipment: None   Additional Comments: 3:1 delivered to room      Prior Functioning/Environment Level of Independence: Independent                 OT Problem List:     OT Treatment/Interventions:      OT Goals(Current goals can be found in the care plan section) Acute Rehab OT Goals Patient Stated Goal: Regain IND OT Goal Formulation: All assessment and education complete, DC therapy  OT Frequency:     Barriers to D/C:  Co-evaluation              End of Session    Activity Tolerance: Patient tolerated treatment well Patient left: in bed;with call bell/phone within reach;with family/visitor present   Time: [id number] OT Time Calculation (min): 20 min Charges:  OT General Charges $OT Visit: 1 Procedure OT Evaluation $OT Eval Low Complexity: 1 Procedure G-Codes: OT G-codes **NOT FOR INPATIENT CLASS** Functional Assessment Tool Used: clinical observation and judgment Functional Limitation: Self care Self Care Current Status [id number]): At least 40 percent but less than 60 percent impaired, limited or restricted Self Care Goal Status [id number]): At least 40 percent but less than 60 percent impaired, limited or restricted Self Care Discharge Status [phone]): At least 40 percent but less than 60 percent impaired, limited or restricted  Alexandra Bowen 05/28/2016,  4:27 PM  Alexandra Bowen, OTR/L [phone] 05/28/2016

## 2016-05-28 NOTE — Care Management Note (Signed)
Case Management Note  Patient Details  Name: Alexandra Bowen MRN: [id number] Date of Birth: 13-Jan-1936  Subjective/Objective:                  RIGHT TOTAL KNEE ARTHROPLASTY (Right)  Action/Plan: Discharge planning Expected Discharge Date:  05/28/16               Expected Discharge Plan:  [place]  In-House Referral:     Discharge planning Services  CM Consult  Post Acute Care Choice:  Home Health Choice offered to:  Patient  DME Arranged:  3-N-1, Walker rolling DME Agency:  [place]:  PT [place] Agency:  [facility]  Status of Service:  Completed, signed off  If discussed at [employer], dates discussed:    Additional Comments: Cm met with pt in room to offer choice of home health agency. Pt chooses [facility] to render HHPT. CM has requested face to face and HHPT order. Referral given to [facility] rep, Tim who is waiting on HHPT order. CM notifed [facility] DME rep, Joelene Millin to deliver the 3n1 and rolling walker.  No other CM needs were communicated. Dellie Catholic, RN 05/28/2016, 12:30 PM

## 2016-05-28 NOTE — Progress Notes (Signed)
OT Cancellation Note  Patient Details Name: Alexandra Bowen MRN: [id number] DOB: Sep 17, 1935   Cancelled Treatment:    Reason Eval/Treat Not Completed: Other (comment).  Pt felt nauseous with PT this pm and is back in bed. Will likely check back in the am.  Primitivo Merkey 05/28/2016, 2:18 PM  Lesle Chris, OTR/L [phone] 05/28/2016

## 2016-05-28 NOTE — Evaluation (Signed)
Physical Therapy Evaluation Patient Details Name: Alexandra Bowen MRN: [id number] DOB: 03/02/1936 Today's Date: 05/28/2016   History of Present Illness  Pt s/p R TKR and hx of breast CA  Clinical Impression  Pt s/p R TKR presents with decreased R LE strength/ROM and post op pain limiting functional mobility.  Pt should progress to dc home with family assist and HHPT follow up.    Follow Up Recommendations Home health PT    Equipment Recommendations  Rolling walker with 5" wheels    Recommendations for Other Services OT consult     Precautions / Restrictions Precautions Precautions: Knee Restrictions Weight Bearing Restrictions: No Other Position/Activity Restrictions: WBAT      Mobility  Bed Mobility Overal bed mobility: Needs Assistance Bed Mobility: Sit to Supine       Sit to supine: Min guard   General bed mobility comments: cues for sequence and use of L LE to self assist  Transfers Overall transfer level: Needs assistance Equipment used: Rolling walker (2 wheeled) Transfers: Sit to/from Stand Sit to Stand: Min assist         General transfer comment: cues for LE management and use of UEs to self assist  Ambulation/Gait Ambulation/Gait assistance: Min assist;Min guard Ambulation Distance (Feet): 70 Feet Assistive device: Rolling walker (2 wheeled) Gait Pattern/deviations: Step-to pattern;Decreased step length - right;Decreased step length - left;Shuffle;Trunk flexed Gait velocity: decr Gait velocity interpretation: Below normal speed for age/gender General Gait Details: cues for sequence, posture and position from [employer]            Wheelchair Mobility    Modified Rankin (Stroke Patients Only)       Balance Overall balance assessment: No apparent balance deficits (not formally assessed)                                           Pertinent Vitals/Pain Pain Assessment: 0-10 Pain Score: 6  Pain Location: R knee Pain  Descriptors / Indicators: Aching;Sore Pain Intervention(s): Limited activity within patient's tolerance;Monitored during session;Premedicated before session;Ice applied    Home Living Family/patient expects to be discharged to:: Private residence Living Arrangements: Other relatives Available Help at Discharge: Family Type of Home: House Home Access: Stairs to enter   Technical brewer of Steps: 1 Home Layout: One level Home Equipment: None      Prior Function Level of Independence: Independent               Hand Dominance        Extremity/Trunk Assessment   Upper Extremity Assessment Upper Extremity Assessment: Overall WFL for tasks assessed    Lower Extremity Assessment Lower Extremity Assessment: RLE deficits/detail RLE Deficits / Details: 3/5 quads with IND SLR and AAROM at R knee -10-  80    Cervical / Trunk Assessment Cervical / Trunk Assessment: Normal  Communication   Communication: No difficulties  Cognition Arousal/Alertness: Awake/alert Behavior During Therapy: WFL for tasks assessed/performed Overall Cognitive Status: Within Functional Limits for tasks assessed                      General Comments      Exercises Total Joint Exercises Ankle Circles/Pumps: AROM;Both;Supine;15 reps Quad Sets: AROM;Both;10 reps;Supine Heel Slides: AAROM;Right;15 reps;Supine Straight Leg Raises: AAROM;Right;10 reps;Supine   Assessment/Plan    PT Assessment Patient needs continued PT services  PT Problem List Decreased  strength;Decreased range of motion;Decreased activity tolerance;Decreased balance;Decreased mobility;Decreased knowledge of use of DME;Pain          PT Treatment Interventions DME instruction;Gait training;Stair training;Functional mobility training;Therapeutic activities;Therapeutic exercise;Patient/family education    PT Goals (Current goals can be found in the Care Plan section)  Acute Rehab PT Goals Patient Stated Goal:  Regain IND PT Goal Formulation: With patient Time For Goal Achievement: 05/31/16 Potential to Achieve Goals: Good    Frequency 7X/week   Barriers to discharge        Co-evaluation               End of Session Equipment Utilized During Treatment: Gait belt Activity Tolerance: Patient tolerated treatment well Patient left: in bed;with call bell/phone within reach Nurse Communication: Mobility status    Functional Assessment Tool Used: Clinical judgement Functional Limitation: Mobility: Walking and moving around Mobility: Walking and Moving Around Current Status [id number]): At least 20 percent but less than 40 percent impaired, limited or restricted Mobility: Walking and Moving Around Goal Status [phone]): At least 1 percent but less than 20 percent impaired, limited or restricted    Time: 1027-1108 PT Time Calculation (min) (ACUTE ONLY): 41 min   Charges:   PT Evaluation $PT Eval Low Complexity: 1 Procedure PT Treatments $Gait Training: 8-22 mins $Therapeutic Exercise: 8-22 mins   PT G Codes:   PT G-Codes **NOT FOR INPATIENT CLASS** Functional Assessment Tool Used: Clinical judgement Functional Limitation: Mobility: Walking and moving around Mobility: Walking and Moving Around Current Status [id number]): At least 20 percent but less than 40 percent impaired, limited or restricted Mobility: Walking and Moving Around Goal Status [phone]): At least 1 percent but less than 20 percent impaired, limited or restricted    Alexandra Bowen 05/28/2016, 12:44 PM

## 2016-05-29 DIAGNOSIS — Z471 Aftercare following joint replacement surgery: Secondary | ICD-10-CM | POA: Diagnosis not present

## 2016-05-29 DIAGNOSIS — M8580 Other specified disorders of bone density and structure, unspecified site: Secondary | ICD-10-CM | POA: Diagnosis not present

## 2016-05-29 DIAGNOSIS — E669 Obesity, unspecified: Secondary | ICD-10-CM | POA: Diagnosis not present

## 2016-05-29 DIAGNOSIS — Z96651 Presence of right artificial knee joint: Secondary | ICD-10-CM | POA: Diagnosis not present

## 2016-05-29 DIAGNOSIS — M1990 Unspecified osteoarthritis, unspecified site: Secondary | ICD-10-CM | POA: Diagnosis not present

## 2016-05-29 DIAGNOSIS — I10 Essential (primary) hypertension: Secondary | ICD-10-CM | POA: Diagnosis not present

## 2016-05-30 DIAGNOSIS — Z471 Aftercare following joint replacement surgery: Secondary | ICD-10-CM | POA: Diagnosis not present

## 2016-05-30 DIAGNOSIS — E669 Obesity, unspecified: Secondary | ICD-10-CM | POA: Diagnosis not present

## 2016-05-30 DIAGNOSIS — I10 Essential (primary) hypertension: Secondary | ICD-10-CM | POA: Diagnosis not present

## 2016-05-30 DIAGNOSIS — Z96651 Presence of right artificial knee joint: Secondary | ICD-10-CM | POA: Diagnosis not present

## 2016-05-30 DIAGNOSIS — M1990 Unspecified osteoarthritis, unspecified site: Secondary | ICD-10-CM | POA: Diagnosis not present

## 2016-05-30 DIAGNOSIS — M8580 Other specified disorders of bone density and structure, unspecified site: Secondary | ICD-10-CM | POA: Diagnosis not present

## 2016-06-02 DIAGNOSIS — Z471 Aftercare following joint replacement surgery: Secondary | ICD-10-CM | POA: Diagnosis not present

## 2016-06-02 DIAGNOSIS — E669 Obesity, unspecified: Secondary | ICD-10-CM | POA: Diagnosis not present

## 2016-06-02 DIAGNOSIS — Z96651 Presence of right artificial knee joint: Secondary | ICD-10-CM | POA: Diagnosis not present

## 2016-06-02 DIAGNOSIS — M1990 Unspecified osteoarthritis, unspecified site: Secondary | ICD-10-CM | POA: Diagnosis not present

## 2016-06-02 DIAGNOSIS — M8580 Other specified disorders of bone density and structure, unspecified site: Secondary | ICD-10-CM | POA: Diagnosis not present

## 2016-06-02 DIAGNOSIS — I10 Essential (primary) hypertension: Secondary | ICD-10-CM | POA: Diagnosis not present

## 2016-06-04 DIAGNOSIS — M1990 Unspecified osteoarthritis, unspecified site: Secondary | ICD-10-CM | POA: Diagnosis not present

## 2016-06-04 DIAGNOSIS — Z96651 Presence of right artificial knee joint: Secondary | ICD-10-CM | POA: Diagnosis not present

## 2016-06-04 DIAGNOSIS — I10 Essential (primary) hypertension: Secondary | ICD-10-CM | POA: Diagnosis not present

## 2016-06-04 DIAGNOSIS — Z471 Aftercare following joint replacement surgery: Secondary | ICD-10-CM | POA: Diagnosis not present

## 2016-06-04 DIAGNOSIS — M8580 Other specified disorders of bone density and structure, unspecified site: Secondary | ICD-10-CM | POA: Diagnosis not present

## 2016-06-04 DIAGNOSIS — E669 Obesity, unspecified: Secondary | ICD-10-CM | POA: Diagnosis not present

## 2016-06-05 DIAGNOSIS — M8580 Other specified disorders of bone density and structure, unspecified site: Secondary | ICD-10-CM | POA: Diagnosis not present

## 2016-06-05 DIAGNOSIS — I10 Essential (primary) hypertension: Secondary | ICD-10-CM | POA: Diagnosis not present

## 2016-06-05 DIAGNOSIS — M1990 Unspecified osteoarthritis, unspecified site: Secondary | ICD-10-CM | POA: Diagnosis not present

## 2016-06-05 DIAGNOSIS — Z96651 Presence of right artificial knee joint: Secondary | ICD-10-CM | POA: Diagnosis not present

## 2016-06-05 DIAGNOSIS — E669 Obesity, unspecified: Secondary | ICD-10-CM | POA: Diagnosis not present

## 2016-06-05 DIAGNOSIS — Z471 Aftercare following joint replacement surgery: Secondary | ICD-10-CM | POA: Diagnosis not present

## 2016-06-05 NOTE — Discharge Summary (Signed)
Physician Discharge Summary  Patient ID: Alexandra Bowen MRN: [id number] DOB/AGE: 1936-03-24 81 y.o.  Admit date: 05/27/2016 Discharge date: 05/28/2016   Procedures:  Procedure(s) (LRB): RIGHT TOTAL KNEE ARTHROPLASTY (Right)  Attending Physician:  Dr. Paralee Cancel   Admission Diagnoses:   Right knee primary OA / pain  Discharge Diagnoses:  Principal Problem:   S/P right TKA Active Problems:   Obesity (BMI 30-39.9)  Past Medical History:  Diagnosis Date  . Allergy   . Arthritis   . Breast cancer ([place])   . 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 Dyslipidemia   . Glucose intolerance (impaired glucose tolerance)   . Hypertension    WHITE COAT SYNDROME  . Obesity   . Renal stone     HPI:    Alexandra Bowen, 81 y.o. female, has a history of pain and functional disability in the right knee due to arthritis and has failed non-surgical conservative treatments for greater than 12 weeks to includeNSAID's and/or analgesics, corticosteriod injections and activity modification.  Onset of symptoms was gradual, starting 1+ years ago with gradually worsening course since that time. The patient noted no past surgery on the right knee(s).  Patient currently rates pain in the right knee(s) at 7 out of 10 with activity. Patient has worsening of pain with activity and weight bearing, pain that interferes with activities of daily living, pain with passive range of motion, crepitus and joint swelling.  Patient has evidence of periarticular osteophytes and joint space narrowing by imaging studies.  There is no active infection.   Risks, benefits and expectations were discussed with the patient.  Risks including but not limited to the risk of anesthesia, blood clots, nerve damage, blood vessel damage, failure of the prosthesis, infection and up to and including death.  Patient understand the risks, benefits and expectations and wishes to proceed with surgery.  PCP:  Wyatt Haste, MD   Discharged Condition: good  Hospital Course:  Patient underwent the above stated procedure on 05/27/2016. Patient tolerated the procedure well and brought to the recovery room in good condition and subsequently to the floor.  POD #1 BP: 128/55 ; Pulse: 58 ; Temp: 97.5 F (36.4 C) ; Resp: 16 Patient reports pain as mild, pain controlled. No events throughout the night. Ready to be discharged home. Dorsiflexion/plantar flexion intact, incision: dressing C/D/I, no cellulitis present and compartment soft.   LABS  Basename    HGB     11.7  HCT     36.5    Discharge Exam: General appearance: alert, cooperative and no distress Extremities: Homans sign is negative, no sign of DVT, no edema, redness or tenderness in the calves or thighs and no ulcers, gangrene or trophic changes  Disposition: Home with follow up in 2 weeks   Follow-up Information    Mauri Pole, MD. Schedule an appointment as soon as possible for a visit in 2 week(s).   Specialty:  Orthopedic Surgery Contact information: [address] [place] [address] [phone]        [facility] Follow up.   Specialty:  [facility] Contact information: [address] [place] [address] [phone]        [facility] Follow up.   Why:  rolling walker and 3n1 Contact information: [address] [place] [address] [phone]  Discharge Instructions    Call MD / Call 911    Complete by:  As directed    If you experience chest pain or shortness of breath, CALL 911 and be transported to the hospital emergency room.  If you develope a fever above 101 F, pus (white drainage) or increased drainage or redness at the wound, or calf pain, call your surgeon's office.   Change dressing    Complete by:  As directed    Maintain surgical dressing until follow up in the clinic. If the edges start to pull up, may  reinforce with tape. If the dressing is no longer working, may remove and cover with gauze and tape, but must keep the area dry and clean.  Call with any questions or concerns.   Constipation Prevention    Complete by:  As directed    Drink plenty of fluids.  Prune juice may be helpful.  You may use a stool softener, such as Colace (over the counter) 100 mg twice a day.  Use MiraLax (over the counter) for constipation as needed.   Diet - low sodium heart healthy    Complete by:  As directed    Discharge instructions    Complete by:  As directed    Maintain surgical dressing until follow up in the clinic. If the edges start to pull up, may reinforce with tape. If the dressing is no longer working, may remove and cover with gauze and tape, but must keep the area dry and clean.  Follow up in 2 weeks at [facility]. Call with any questions or concerns.   Increase activity slowly as tolerated    Complete by:  As directed    Weight bearing as tolerated with assist device (walker, cane, etc) as directed, use it as long as suggested by your surgeon or therapist, typically at least 4-6 weeks.   TED hose    Complete by:  As directed    Use stockings (TED hose) for 2 weeks on both leg(s).  You may remove them at night for sleeping.      Allergies as of 05/28/2016   No Known Allergies     Medication List    STOP taking these medications   ADVIL 200 MG tablet Generic drug:  ibuprofen   aspirin 81 MG tablet Replaced by:  aspirin 81 MG chewable tablet     TAKE these medications   amLODipine 5 MG tablet Commonly known as:  NORVASC Take 1 tablet (5 mg total) by mouth daily.   aspirin 81 MG chewable tablet Chew 1 tablet (81 mg total) by mouth 2 (two) times daily. Take for 4 weeks, then resume regular dosing. Replaces:  aspirin 81 MG tablet   bisoprolol-hydrochlorothiazide 10-6.25 MG tablet Commonly known as:  ZIAC Take 1 tablet by mouth daily.   CALCIUM 600+D3 PO Take 1 tablet  by mouth daily.   celecoxib 200 MG capsule Commonly known as:  CELEBREX Take 1 capsule (200 mg total) by mouth every 12 (twelve) hours.   docusate sodium 100 MG capsule Commonly known as:  COLACE Take 1 capsule (100 mg total) by mouth 2 (two) times daily.   HYDROcodone-acetaminophen 7.5-325 MG tablet Commonly known as:  NORCO Take 1-2 tablets by mouth every 4 (four) hours.   methocarbamol 500 MG tablet Commonly known as:  ROBAXIN Take 1 tablet (500 mg total) by mouth every 6 (six) hours as needed for muscle spasms.   multivitamin with minerals tablet Take 1 tablet  by mouth daily.   polyethylene glycol packet Commonly known as:  MIRALAX / GLYCOLAX Take 17 g by mouth 2 (two) times daily.   simvastatin 20 MG tablet Commonly known as:  ZOCOR Take 1 tablet (20 mg total) by mouth at bedtime.        Signed: [place] Pugh. Spencer Peterkin   PA-C  06/05/2016, 10:18 AM

## 2016-06-10 DIAGNOSIS — I10 Essential (primary) hypertension: Secondary | ICD-10-CM | POA: Diagnosis not present

## 2016-06-10 DIAGNOSIS — Z471 Aftercare following joint replacement surgery: Secondary | ICD-10-CM | POA: Diagnosis not present

## 2016-06-10 DIAGNOSIS — M8580 Other specified disorders of bone density and structure, unspecified site: Secondary | ICD-10-CM | POA: Diagnosis not present

## 2016-06-10 DIAGNOSIS — M1990 Unspecified osteoarthritis, unspecified site: Secondary | ICD-10-CM | POA: Diagnosis not present

## 2016-06-10 DIAGNOSIS — Z96651 Presence of right artificial knee joint: Secondary | ICD-10-CM | POA: Diagnosis not present

## 2016-06-10 DIAGNOSIS — E669 Obesity, unspecified: Secondary | ICD-10-CM | POA: Diagnosis not present

## 2016-06-13 DIAGNOSIS — Z96651 Presence of right artificial knee joint: Secondary | ICD-10-CM | POA: Diagnosis not present

## 2016-06-13 DIAGNOSIS — M1990 Unspecified osteoarthritis, unspecified site: Secondary | ICD-10-CM | POA: Diagnosis not present

## 2016-06-13 DIAGNOSIS — Z471 Aftercare following joint replacement surgery: Secondary | ICD-10-CM | POA: Diagnosis not present

## 2016-06-13 DIAGNOSIS — E669 Obesity, unspecified: Secondary | ICD-10-CM | POA: Diagnosis not present

## 2016-06-13 DIAGNOSIS — I10 Essential (primary) hypertension: Secondary | ICD-10-CM | POA: Diagnosis not present

## 2016-06-13 DIAGNOSIS — M8580 Other specified disorders of bone density and structure, unspecified site: Secondary | ICD-10-CM | POA: Diagnosis not present

## 2016-06-17 DIAGNOSIS — M1711 Unilateral primary osteoarthritis, right knee: Secondary | ICD-10-CM | POA: Diagnosis not present

## 2016-06-19 DIAGNOSIS — M1711 Unilateral primary osteoarthritis, right knee: Secondary | ICD-10-CM | POA: Diagnosis not present

## 2016-06-23 DIAGNOSIS — M1711 Unilateral primary osteoarthritis, right knee: Secondary | ICD-10-CM | POA: Diagnosis not present

## 2016-06-25 DIAGNOSIS — M1711 Unilateral primary osteoarthritis, right knee: Secondary | ICD-10-CM | POA: Diagnosis not present

## 2016-06-30 DIAGNOSIS — M1711 Unilateral primary osteoarthritis, right knee: Secondary | ICD-10-CM | POA: Diagnosis not present

## 2016-07-02 DIAGNOSIS — M1711 Unilateral primary osteoarthritis, right knee: Secondary | ICD-10-CM | POA: Diagnosis not present

## 2016-07-09 DIAGNOSIS — M1711 Unilateral primary osteoarthritis, right knee: Secondary | ICD-10-CM | POA: Diagnosis not present

## 2016-07-10 DIAGNOSIS — Z96651 Presence of right artificial knee joint: Secondary | ICD-10-CM | POA: Diagnosis not present

## 2016-07-10 DIAGNOSIS — Z471 Aftercare following joint replacement surgery: Secondary | ICD-10-CM | POA: Diagnosis not present

## 2016-07-15 ENCOUNTER — Other Ambulatory Visit: Payer: Self-pay | Admitting: Family Medicine

## 2016-07-15 ENCOUNTER — Other Ambulatory Visit: Payer: Self-pay

## 2016-07-15 DIAGNOSIS — Z853 Personal history of malignant neoplasm of breast: Secondary | ICD-10-CM

## 2016-08-22 ENCOUNTER — Ambulatory Visit
Admission: RE | Admit: 2016-08-22 | Discharge: 2016-08-22 | Disposition: A | Discharge status: Home / Self Care | Payer: Medicare HMO | Source: Ambulatory Visit | Attending: Family Medicine | Admitting: Family Medicine

## 2016-08-22 DIAGNOSIS — R928 Other abnormal and inconclusive findings on diagnostic imaging of breast: Secondary | ICD-10-CM | POA: Diagnosis not present

## 2016-08-22 DIAGNOSIS — Z853 Personal history of malignant neoplasm of breast: Secondary | ICD-10-CM

## 2016-10-03 NOTE — Addendum Note (Signed)
Addendum  created 10/03/16 0944 by Lyn Hollingshead, MD   Sign clinical note

## 2016-11-12 DIAGNOSIS — Z471 Aftercare following joint replacement surgery: Secondary | ICD-10-CM | POA: Diagnosis not present

## 2016-11-12 DIAGNOSIS — Z96651 Presence of right artificial knee joint: Secondary | ICD-10-CM | POA: Diagnosis not present

## 2016-12-10 ENCOUNTER — Other Ambulatory Visit: Payer: Self-pay | Admitting: Family Medicine

## 2016-12-10 DIAGNOSIS — I10 Essential (primary) hypertension: Secondary | ICD-10-CM

## 2016-12-10 DIAGNOSIS — E785 Hyperlipidemia, unspecified: Secondary | ICD-10-CM

## 2017-01-16 DIAGNOSIS — Z853 Personal history of malignant neoplasm of breast: Secondary | ICD-10-CM | POA: Diagnosis not present

## 2017-01-20 ENCOUNTER — Encounter: Payer: Self-pay | Admitting: Family Medicine

## 2017-01-20 ENCOUNTER — Ambulatory Visit (INDEPENDENT_AMBULATORY_CARE_PROVIDER_SITE_OTHER): Payer: Medicare HMO | Admitting: Family Medicine

## 2017-01-20 VITALS — BP 120/80 | HR 63 | Ht 65.0 in | Wt 193.2 lb

## 2017-01-20 DIAGNOSIS — M8580 Other specified disorders of bone density and structure, unspecified site: Secondary | ICD-10-CM | POA: Diagnosis not present

## 2017-01-20 DIAGNOSIS — I10 Essential (primary) hypertension: Secondary | ICD-10-CM | POA: Diagnosis not present

## 2017-01-20 DIAGNOSIS — E785 Hyperlipidemia, unspecified: Secondary | ICD-10-CM

## 2017-01-20 DIAGNOSIS — E669 Obesity, unspecified: Secondary | ICD-10-CM

## 2017-01-20 DIAGNOSIS — Z8249 Family history of ischemic heart disease and other diseases of the circulatory system: Secondary | ICD-10-CM | POA: Diagnosis not present

## 2017-01-20 DIAGNOSIS — C50411 Malignant neoplasm of upper-outer quadrant of right female breast: Secondary | ICD-10-CM

## 2017-01-20 DIAGNOSIS — Z9849 Cataract extraction status, unspecified eye: Secondary | ICD-10-CM | POA: Diagnosis not present

## 2017-01-20 DIAGNOSIS — R7309 Other abnormal glucose: Secondary | ICD-10-CM | POA: Diagnosis not present

## 2017-01-20 DIAGNOSIS — Z87442 Personal history of urinary calculi: Secondary | ICD-10-CM | POA: Diagnosis not present

## 2017-01-20 DIAGNOSIS — Z96651 Presence of right artificial knee joint: Secondary | ICD-10-CM | POA: Diagnosis not present

## 2017-01-20 DIAGNOSIS — M1990 Unspecified osteoarthritis, unspecified site: Secondary | ICD-10-CM

## 2017-01-20 MED ORDER — SIMVASTATIN 20 MG PO TABS: ORAL_TABLET | ORAL | 3 refills | Status: DC

## 2017-01-20 MED ORDER — BISOPROLOL-HYDROCHLOROTHIAZIDE 10-6.25 MG PO TABS: 1.0000 | ORAL_TABLET | Freq: Every day | ORAL | 3 refills | Status: DC

## 2017-01-20 MED ORDER — AMLODIPINE BESYLATE 5 MG PO TABS: ORAL_TABLET | ORAL | 3 refills | Status: DC

## 2017-01-20 NOTE — Progress Notes (Signed)
Alexandra Bowen is a 81 y.o. female who presents for annual wellness visit and follow-up on chronic medical conditions.  She has the following concerns: Recently her brother died and she is still grieving over this but seems to be handling this fairly well.she has had a recent knee replacement and seems be doing fairly well with this. Hopefully when this quiets down she will be able to take better care of herself in terms of diet and exercise.she continues on her blood pressure medications as well as simvastatin and is having no difficulties with them. She does have a lesion present on her nose and does plan to see dermatology for that. She also will be seen within the next year for follow-up on her breast cancer. He was seen prior to her surgery due to an abnormal EKG however was cleared for surgery . She wants to hold off on getting the flu shot.she has had bilateral cataract surgery. He does have osteopenia however is not interested in another DEXA scan. Immunizations and Health Maintenance Immunization History  Administered Date(s) Administered  . Influenza Split 02/15/1996, 02/22/2007, 02/11/2011, 02/10/2012  . Influenza Whole 01/12/2008, 02/14/2009, 02/25/2010  . Influenza, High Dose Seasonal PF 02/03/2013, 02/06/2014, 01/22/2015, 02/05/2016  . Pneumococcal Conjugate-13 12/13/2015  . Pneumococcal Polysaccharide-23 08/25/2005, 12/06/2012  . Td 07/26/1996  . Tdap 11/04/2006  . Zoster 08/25/2005   Health Maintenance Due  Topic Date Due  . TETANUS/TDAP  11/03/2016  . INFLUENZA VACCINE  11/26/2016    Last Pap smear: don't get these anymore Last mammogram: April 2018 Last colonoscopy: 2007- declines having another one done Last DEXA: 2012- declines having another one done Dentist: Dr. Toy Cookey Ophtho: Dr. Quay Burow Exercise: exercise everyday  Other doctors caring for patient include: Dr. Toy Cookey- Dentist Dr. Quay Burow- Eye Ortho- Dr. Ricard Dillon   Advanced directives:yesasked to bring a copy.     Depression screen:  See questionnaire below.  Depression screen [facility] 2/9 01/20/2017 12/13/2015 12/11/2014 12/06/2012 11/24/2011  Decreased Interest 0 0 0 0 0  Down, Depressed, Hopeless 0 0 0 0 0  PHQ - 2 Score 0 0 0 0 0    Fall Risk Screen: see questionnaire below. Fall Risk  01/20/2017 12/13/2015 12/11/2014 12/07/2013  Falls in the past year? No No No No    ADL screen:  See questionnaire below Functional Status Survey: Is the patient deaf or have difficulty hearing?: No Does the patient have difficulty seeing, even when wearing glasses/contacts?: No Does the patient have difficulty concentrating, remembering, or making decisions?: No Does the patient have difficulty walking or climbing stairs?: No Does the patient have difficulty dressing or bathing?: No Does the patient have difficulty doing errands alone such as visiting a doctor's office or shopping?: No   Review of Systems [place]kins except as above  PHYSICAL EXAM:  Ht 5\' 5"  (1.651 m)   Wt 193 lb 3.2 oz (87.6 kg)   BMI 32.15 kg/m   General Appearance: Alert, cooperative, no distress, appears stated age Head: Normocephalic, without obvious abnormality, atraumatic Eyes: PERRL, conjunctiva/corneas clear, EOM's intact, fundi benign Ears: Normal TM's and external ear canals Nose: Nares normal, mucosa normal, no drainage or sinus tenderness.a 1 cm raised slightly erythematous lesions noted on the tip of the nose Throat: Lips, mucosa, and tongue normal; teeth and gums normal Neck: Supple, no lymphadenopathy;  thyroid:  no enlargement/tenderness/nodules; no carotid bruit or JVD Lungs: Clear to auscultation bilaterally without wheezes, rales or ronchi; respirations unlabored Heart: Regular rate and rhythm, S1 and S2 normal,  no murmur, rubor gallop Abdomen: Soft, non-tender, nondistended, normoactive bowel sounds,  no masses, no hepatosplenomegaly Extremities: No clubbing, cyanosis or edema Pulses: 2+ and symmetric all extremities Skin:   Skin color, texture, turgor normal, no rashes or lesions Lymph nodes: Cervical, supraclavicular, and axillary nodes normal Neurologic:  CNII-XII intact, normal strength, sensation and gait; reflexes 2+ and symmetric throughout Psych: Normal mood, affect, hygiene and grooming.  ASSESSMENT/PLAN: Malignant neoplasm of upper-outer quadrant of right female breast, unspecified estrogen receptor status ([place]) - Plan: CBC with Differential/Platelet, Comprehensive metabolic panel  S/P right TKA  Essential hypertension - Plan: CBC with Differential/Platelet, Comprehensive metabolic panel, bisoprolol-hydrochlorothiazide (ZIAC) 10-6.25 MG tablet, amLODipine (NORVASC) 5 MG tablet  Osteopenia, unspecified location  Obesity (BMI 30-39.9) - Plan: CBC with Differential/Platelet, Comprehensive metabolic panel, Lipid panel  Hyperlipidemia LDL goal <100 - Plan: simvastatin (ZOCOR) 20 MG tablet  History of kidney stones  Family history of heart disease in female family member before age 33 - Plan: CBC with Differential/Platelet, Comprehensive metabolic panel, Lipid panel  History of cataract extraction, unspecified laterality  Arthritis overall she is doing quite nicely. The lesion on her nose appears to be a pyogenic granuloma. Encouraged her to follow-up with hospice concerning bereavement counseling. Continue to work on knee rehabilitation. Continue on present medications.   Medicare Attestation I have personally reviewed: The patient's medical and social history Their use of alcohol, tobacco or illicit drugs Their current medications and supplements The patient's functional ability including ADLs,fall risks, home safety risks, cognitive, and hearing and visual impairment Diet and physical activities Evidence for depression or mood disorders  The patient's weight, height, and BMI have been recorded in the chart.  I have made referrals, counseling, and provided education to the patient based on  review of the above and I have provided the patient with a written personalized care plan for preventive services.     Wyatt Haste, MD   01/20/2017

## 2017-01-20 NOTE — Patient Instructions (Signed)
  Alexandra Bowen , Thank you for taking time to come for your Medicare Wellness Visit. I appreciate your ongoing commitment to your health goals. Please review the following plan we discussed and let me know if I can assist you in the future.   These are the goals we discussed: Goals    None      This is a list of the screening recommended for you and due dates:  Health Maintenance  Topic Date Due  . Tetanus Vaccine  11/03/2016  . Flu Shot  11/26/2016  . DEXA scan (bone density measurement)  Completed  . Pneumonia vaccines  Completed

## 2017-01-22 LAB — LIPID PANEL
CHOL/HDL RATIO: 2.6 (calc) (ref <5.0)
Cholesterol: 154 mg/dL (ref <200)
HDL: 59 mg/dL (ref >50)
LDL Cholesterol (Calc): 78 mg/dL (calc)
NON-HDL CHOLESTEROL (CALC): 95 mg/dL (ref <130)
TRIGLYCERIDES: 85 mg/dL (ref <150)

## 2017-01-22 LAB — CBC WITH DIFFERENTIAL/PLATELET
BASOS PCT: 0.4 %
Basophils Absolute: 33 cells/uL (ref 0–200)
EOS PCT: 2.1 %
Eosinophils Absolute: 174 cells/uL (ref 15–500)
HCT: 44.6 % (ref 35.0–45.0)
HEMOGLOBIN: 14.3 g/dL (ref 11.7–15.5)
Lymphs Abs: 1834 cells/uL (ref 850–3900)
MCH: 26.1 pg — ABNORMAL LOW (ref 27.0–33.0)
MCHC: 32.1 g/dL (ref 32.0–36.0)
MCV: 81.4 fL (ref 80.0–100.0)
MONOS PCT: 6.8 %
MPV: 12 fL (ref 7.5–12.5)
Neutro Abs: 5694 cells/uL (ref 1500–7800)
Neutrophils Relative %: 68.6 %
PLATELETS: 220 10*3/uL (ref 140–400)
RBC: 5.48 10*6/uL — ABNORMAL HIGH (ref 3.80–5.10)
RDW: 13.2 % (ref 11.0–15.0)
TOTAL LYMPHOCYTE: 22.1 %
WBC mixed population: 564 cells/uL (ref 200–950)
WBC: 8.3 10*3/uL (ref 3.8–10.8)

## 2017-01-22 LAB — COMPREHENSIVE METABOLIC PANEL
AG RATIO: 1.8 (calc) (ref 1.0–2.5)
ALT: 16 U/L (ref 6–29)
AST: 20 U/L (ref 10–35)
Albumin: 4.4 g/dL (ref 3.6–5.1)
Alkaline phosphatase (APISO): 125 U/L (ref 33–130)
BUN: 15 mg/dL (ref 7–25)
CO2: 28 mmol/L (ref 20–32)
CREATININE: 0.79 mg/dL (ref 0.60–0.93)
Calcium: 9.4 mg/dL (ref 8.6–10.4)
Chloride: 102 mmol/L (ref 98–110)
GLOBULIN: 2.5 g/dL (ref 1.9–3.7)
GLUCOSE: 123 mg/dL — AB (ref 65–99)
Potassium: 4 mmol/L (ref 3.5–5.3)
SODIUM: 142 mmol/L (ref 135–146)
TOTAL PROTEIN: 6.9 g/dL (ref 6.1–8.1)
Total Bilirubin: 0.9 mg/dL (ref 0.2–1.2)

## 2017-01-22 LAB — TEST AUTHORIZATION

## 2017-01-22 LAB — HEMOGLOBIN A1C W/OUT EAG: Hgb A1c MFr Bld: 5.6 % of total Hgb (ref <5.7)

## 2017-02-02 ENCOUNTER — Other Ambulatory Visit (INDEPENDENT_AMBULATORY_CARE_PROVIDER_SITE_OTHER): Payer: Medicare HMO

## 2017-02-02 DIAGNOSIS — Z23 Encounter for immunization: Secondary | ICD-10-CM

## 2017-02-04 DIAGNOSIS — D485 Neoplasm of uncertain behavior of skin: Secondary | ICD-10-CM | POA: Diagnosis not present

## 2017-02-04 DIAGNOSIS — Z85828 Personal history of other malignant neoplasm of skin: Secondary | ICD-10-CM | POA: Diagnosis not present

## 2017-02-04 DIAGNOSIS — C44311 Basal cell carcinoma of skin of nose: Secondary | ICD-10-CM

## 2017-02-04 HISTORY — DX: Basal cell carcinoma of skin of nose: C44.311

## 2017-02-10 ENCOUNTER — Encounter: Payer: Self-pay | Admitting: Family Medicine

## 2017-02-18 DIAGNOSIS — C44311 Basal cell carcinoma of skin of nose: Secondary | ICD-10-CM | POA: Diagnosis not present

## 2017-02-18 DIAGNOSIS — Z85828 Personal history of other malignant neoplasm of skin: Secondary | ICD-10-CM | POA: Diagnosis not present

## 2017-03-13 ENCOUNTER — Other Ambulatory Visit: Payer: Self-pay | Admitting: Family Medicine

## 2017-03-13 DIAGNOSIS — E785 Hyperlipidemia, unspecified: Secondary | ICD-10-CM

## 2017-04-15 DIAGNOSIS — Z96651 Presence of right artificial knee joint: Secondary | ICD-10-CM | POA: Diagnosis not present

## 2017-04-15 DIAGNOSIS — G8929 Other chronic pain: Secondary | ICD-10-CM | POA: Diagnosis not present

## 2017-04-15 DIAGNOSIS — M25561 Pain in right knee: Secondary | ICD-10-CM | POA: Diagnosis not present

## 2017-04-15 DIAGNOSIS — M25562 Pain in left knee: Secondary | ICD-10-CM | POA: Diagnosis not present

## 2017-04-15 DIAGNOSIS — Z471 Aftercare following joint replacement surgery: Secondary | ICD-10-CM | POA: Diagnosis not present

## 2017-07-22 ENCOUNTER — Other Ambulatory Visit: Payer: Self-pay | Admitting: Family Medicine

## 2017-07-22 DIAGNOSIS — Z853 Personal history of malignant neoplasm of breast: Secondary | ICD-10-CM

## 2017-07-22 DIAGNOSIS — I4891 Unspecified atrial fibrillation: Secondary | ICD-10-CM

## 2017-08-24 ENCOUNTER — Ambulatory Visit
Admission: RE | Admit: 2017-08-24 | Discharge: 2017-08-24 | Disposition: A | Discharge status: Home / Self Care | Payer: Medicare HMO | Source: Ambulatory Visit | Attending: Family Medicine | Admitting: Family Medicine

## 2017-08-24 DIAGNOSIS — Z853 Personal history of malignant neoplasm of breast: Secondary | ICD-10-CM

## 2017-08-24 DIAGNOSIS — R928 Other abnormal and inconclusive findings on diagnostic imaging of breast: Secondary | ICD-10-CM | POA: Diagnosis not present

## 2017-08-24 LAB — HM MAMMOGRAPHY

## 2018-01-25 DIAGNOSIS — Z853 Personal history of malignant neoplasm of breast: Secondary | ICD-10-CM | POA: Diagnosis not present

## 2018-01-26 ENCOUNTER — Encounter: Payer: Self-pay | Admitting: Family Medicine

## 2018-01-26 ENCOUNTER — Ambulatory Visit (INDEPENDENT_AMBULATORY_CARE_PROVIDER_SITE_OTHER): Payer: Medicare HMO | Admitting: Family Medicine

## 2018-01-26 VITALS — BP 130/80 | HR 95 | Temp 98.2°F | Ht 65.0 in | Wt 200.8 lb

## 2018-01-26 DIAGNOSIS — Z0000 Encounter for general adult medical examination without abnormal findings: Secondary | ICD-10-CM | POA: Diagnosis not present

## 2018-01-26 DIAGNOSIS — M8580 Other specified disorders of bone density and structure, unspecified site: Secondary | ICD-10-CM | POA: Diagnosis not present

## 2018-01-26 DIAGNOSIS — E785 Hyperlipidemia, unspecified: Secondary | ICD-10-CM | POA: Diagnosis not present

## 2018-01-26 DIAGNOSIS — I10 Essential (primary) hypertension: Secondary | ICD-10-CM | POA: Diagnosis not present

## 2018-01-26 DIAGNOSIS — E669 Obesity, unspecified: Secondary | ICD-10-CM

## 2018-01-26 DIAGNOSIS — M1990 Unspecified osteoarthritis, unspecified site: Secondary | ICD-10-CM

## 2018-01-26 DIAGNOSIS — Z23 Encounter for immunization: Secondary | ICD-10-CM | POA: Diagnosis not present

## 2018-01-26 DIAGNOSIS — Z8249 Family history of ischemic heart disease and other diseases of the circulatory system: Secondary | ICD-10-CM

## 2018-01-26 DIAGNOSIS — C50411 Malignant neoplasm of upper-outer quadrant of right female breast: Secondary | ICD-10-CM

## 2018-01-26 DIAGNOSIS — Z96651 Presence of right artificial knee joint: Secondary | ICD-10-CM | POA: Diagnosis not present

## 2018-01-26 DIAGNOSIS — Z85828 Personal history of other malignant neoplasm of skin: Secondary | ICD-10-CM

## 2018-01-26 MED ORDER — BISOPROLOL-HYDROCHLOROTHIAZIDE 10-6.25 MG PO TABS: 1.0000 | ORAL_TABLET | Freq: Every day | ORAL | 3 refills | Status: DC

## 2018-01-26 MED ORDER — SIMVASTATIN 20 MG PO TABS: ORAL_TABLET | ORAL | 3 refills | Status: DC

## 2018-01-26 MED ORDER — AMLODIPINE BESYLATE 5 MG PO TABS: ORAL_TABLET | ORAL | 3 refills | Status: DC

## 2018-01-26 NOTE — Progress Notes (Signed)
Alexandra Bowen is a 82 y.o. female who presents for annual wellness visit,CPE and follow-up on chronic medical conditions.  She is now 5 years cancer free.  She is very happy about this.  She does have a history of osteopenia but is not interested in following up with a DEXA scan.  Her allergies are under good control.  Arthritis gives her slight difficulty and she uses Advil on an as-needed basis.  She has had a knee replacement but still has some difficulties with that.  She states that she has been doing her exercises regularly.  She has had difficulty recently with PND and rhinorrhea but no sneezing, itchy watery eyes.  She was given an OTC med but has not tried it yet.  She continues on simvastatin and having no difficulty with that.  She is also taking amlodipine and Ziac and having no difficulty with that.  She has had both shingles shots.  She did have a BCE removed from her left nasal area.  Immunizations and Health Maintenance Immunization History  Administered Date(s) Administered  . Influenza Split 02/15/1996, 02/22/2007, 02/11/2011, 02/10/2012  . Influenza Whole 01/12/2008, 02/14/2009, 02/25/2010  . Influenza, High Dose Seasonal PF 02/03/2013, 02/06/2014, 01/22/2015, 02/05/2016, 02/02/2017  . Pneumococcal Conjugate-13 12/13/2015  . Pneumococcal Polysaccharide-23 08/25/2005, 12/06/2012  . Td 07/26/1996  . Tdap 11/04/2006, 01/22/2017  . Zoster 08/25/2005  . Zoster Recombinat (Shingrix) 01/22/2017   Health Maintenance Due  Topic Date Due  . INFLUENZA VACCINE  11/26/2017    Last Pap smear:n/a Last mammogram: 07/2017 Last colonoscopy:over five years Last DEXA: pt decline Dentist:09/2017 Ophtho:nov 201 8urns  Exercise: bike , walking , yard work  Other doctors caring for patient include:Byerly.Alvan Dame  Advanced directives: No copy asked for    Depression screen:  See questionnaire below.  Depression screen [facility] 2/9 01/26/2018 01/20/2017 12/13/2015 12/11/2014 12/06/2012  Decreased  Interest 0 0 0 0 0  Down, Depressed, Hopeless 0 0 0 0 0  PHQ - 2 Score 0 0 0 0 0    Fall Risk Screen: see questionnaire below. Fall Risk  01/26/2018 01/20/2017 12/13/2015 12/11/2014 12/07/2013  Falls in the past year? No No No No No    ADL screen:  See questionnaire below Functional Status Survey: Is the patient deaf or have difficulty hearing?: No Does the patient have difficulty seeing, even when wearing glasses/contacts?: No Does the patient have difficulty concentrating, remembering, or making decisions?: No Does the patient have difficulty walking or climbing stairs?: No Does the patient have difficulty dressing or bathing?: No Does the patient have difficulty doing errands alone such as visiting a doctor's office or shopping?: No   Review of Systems Constitutional: -, -unexpected weight change, -anorexia, -fatigue Allergy: -sneezing, -itching, -congestion Dermatology: denies changing moles, rash, lumps ENT: -runny nose, -ear pain, -sore throat,  Cardiology:  -chest pain, -palpitations, -orthopnea, Respiratory: -cough, -shortness of breath, -dyspnea on exertion, -wheezing,  Gastroenterology: -abdominal pain, -nausea, -vomiting, -diarrhea, -constipation, -dysphagia Hematology: -bleeding or bruising problems Musculoskeletal: -arthralgias, -myalgias, -joint swelling, -back pain, - Ophthalmology: -vision changes,  Urology: -dysuria, -difficulty urinating,  -urinary frequency, -urgency, incontinence Neurology: -, -numbness, , -memory loss, -falls, -dizziness    PHYSICAL EXAM:   General Appearance: Alert, cooperative, no distress, appears stated age Head: Normocephalic, without obvious abnormality, atraumatic Eyes: PERRL, conjunctiva/corneas clear, EOM's intact, fundi benign Ears: Normal TM's and external ear canals Nose: Nares normal, mucosa normal, no drainage or sinus tenderness Throat: Lips, mucosa, and tongue normal; teeth and gums normal Neck: Supple,  no lymphadenopathy;   thyroid:  no enlargement/tenderness/nodules; no carotid bruit or JVD Lungs: Clear to auscultation bilaterally without wheezes, rales or ronchi; respirations unlabored Heart: Regular rate and rhythm, S1 and S2 normal, no murmur, rubor gallop Abdomen: Soft, non-tender, nondistended, normoactive bowel sounds,  no masses, no hepatosplenomegaly Extremities: No clubbing, cyanosis or edema Pulses: 2+ and symmetric all extremities Skin:  Skin color, texture, turgor normal, no rashes or lesions Lymph nodes: Cervical, supraclavicular, and axillary nodes normal Neurologic:  CNII-XII intact, normal strength, sensation and gait; reflexes 2+ and symmetric throughout Psych: Normal mood, affect, hygiene and grooming.  ASSESSMENT/PLAN: Routine general medical examination at a health care facility  S/P right TKA  Essential hypertension - Plan: CBC with Differential/Platelet, Comprehensive metabolic panel, bisoprolol-hydrochlorothiazide (ZIAC) 10-6.25 MG tablet, amLODipine (NORVASC) 5 MG tablet  Osteopenia, unspecified location  Obesity (BMI 30-39.9) - Plan: CBC with Differential/Platelet, Comprehensive metabolic panel, Lipid panel  Hyperlipidemia LDL goal <100 - Plan: Lipid panel, simvastatin (ZOCOR) 20 MG tablet  Malignant neoplasm of upper-outer quadrant of right female breast, unspecified estrogen receptor status ([place])  Need for influenza vaccination - Plan: Flu vaccine HIGH DOSE PF (Fluzone High dose)  Arthritis  Family history of heart disease in female family member before age 19 - Plan: CBC with Differential/Platelet, Comprehensive metabolic panel, Lipid panel  History of basal cell carcinoma  I encouraged her to continue to take good care of herself.  If she has further trouble with her postnasal drainage she will call me.  Encouraged her to continue with rehab on her knee.  Immunization recommendations discussed.  Colonoscopy recommendations reviewed   Medicare Attestation I have  personally reviewed: The patient's medical and social history Their use of alcohol, tobacco or illicit drugs Their current medications and supplements The patient's functional ability including ADLs,fall risks, home safety risks, cognitive, and hearing and visual impairment Diet and physical activities Evidence for depression or mood disorders  The patient's weight, height, and BMI have been recorded in the chart.  I have made referrals, counseling, and provided education to the patient based on review of the above and I have provided the patient with a written personalized care plan for preventive services.     [name], MD   01/26/2018

## 2018-01-26 NOTE — Patient Instructions (Signed)
  Ms. Dicenzo , Thank you for taking time to come for your Medicare Wellness Visit. I appreciate your ongoing commitment to your health goals. Please review the following plan we discussed and let me know if I can assist you in the future.   These are the goals we discussed: Goals   None     This is a list of the screening recommended for you and due dates:  Health Maintenance  Topic Date Due  . Flu Shot  11/26/2017  . Tetanus Vaccine  01/23/2027  . DEXA scan (bone density measurement)  Completed  . Pneumonia vaccines  Completed

## 2018-01-27 LAB — CBC WITH DIFFERENTIAL/PLATELET
BASOS ABS: 0.1 10*3/uL (ref 0.0–0.2)
Basos: 1 %
EOS (ABSOLUTE): 0.6 10*3/uL — ABNORMAL HIGH (ref 0.0–0.4)
Eos: 6 %
HEMATOCRIT: 45.6 % (ref 34.0–46.6)
HEMOGLOBIN: 14.1 g/dL (ref 11.1–15.9)
Immature Grans (Abs): 0 10*3/uL (ref 0.0–0.1)
Immature Granulocytes: 0 %
Lymphocytes Absolute: 2.6 10*3/uL (ref 0.7–3.1)
Lymphs: 26 %
MCH: 25.5 pg — AB (ref 26.6–33.0)
MCHC: 30.9 g/dL — ABNORMAL LOW (ref 31.5–35.7)
MCV: 83 fL (ref 79–97)
MONOCYTES: 6 %
MONOS ABS: 0.6 10*3/uL (ref 0.1–0.9)
NEUTROS ABS: 6.1 10*3/uL (ref 1.4–7.0)
Neutrophils: 61 %
Platelets: 251 10*3/uL (ref 150–450)
RBC: 5.53 x10E6/uL — ABNORMAL HIGH (ref 3.77–5.28)
RDW: 12.9 % (ref 12.3–15.4)
WBC: 10 10*3/uL (ref 3.4–10.8)

## 2018-01-27 LAB — LIPID PANEL
CHOL/HDL RATIO: 2.6 ratio (ref 0.0–4.4)
Cholesterol, Total: 140 mg/dL (ref 100–199)
HDL: 54 mg/dL (ref >39)
LDL CALC: 72 mg/dL (ref 0–99)
TRIGLYCERIDES: 71 mg/dL (ref 0–149)
VLDL CHOLESTEROL CAL: 14 mg/dL (ref 5–40)

## 2018-01-27 LAB — COMPREHENSIVE METABOLIC PANEL
ALBUMIN: 4.6 g/dL (ref 3.5–4.7)
ALK PHOS: 106 IU/L (ref 39–117)
ALT: 17 IU/L (ref 0–32)
AST: 23 IU/L (ref 0–40)
Albumin/Globulin Ratio: 1.7 (ref 1.2–2.2)
BILIRUBIN TOTAL: 0.7 mg/dL (ref 0.0–1.2)
BUN/Creatinine Ratio: 21 (ref 12–28)
BUN: 16 mg/dL (ref 8–27)
CO2: 24 mmol/L (ref 20–29)
Calcium: 9.6 mg/dL (ref 8.7–10.3)
Chloride: 102 mmol/L (ref 96–106)
Creatinine, Ser: 0.77 mg/dL (ref 0.57–1.00)
GFR calc Af Amer: 84 mL/min/{1.73_m2} (ref >59)
GFR calc non Af Amer: 73 mL/min/{1.73_m2} (ref >59)
GLOBULIN, TOTAL: 2.7 g/dL (ref 1.5–4.5)
GLUCOSE: 114 mg/dL — AB (ref 65–99)
Potassium: 3.9 mmol/L (ref 3.5–5.2)
Sodium: 143 mmol/L (ref 134–144)
Total Protein: 7.3 g/dL (ref 6.0–8.5)

## 2018-02-09 ENCOUNTER — Encounter: Payer: Self-pay | Admitting: Family Medicine

## 2018-03-05 ENCOUNTER — Other Ambulatory Visit: Payer: Self-pay | Admitting: Family Medicine

## 2018-03-05 DIAGNOSIS — E785 Hyperlipidemia, unspecified: Secondary | ICD-10-CM

## 2018-03-05 DIAGNOSIS — I10 Essential (primary) hypertension: Secondary | ICD-10-CM

## 2018-03-17 DIAGNOSIS — H5203 Hypermetropia, bilateral: Secondary | ICD-10-CM | POA: Diagnosis not present

## 2018-04-26 ENCOUNTER — Ambulatory Visit (INDEPENDENT_AMBULATORY_CARE_PROVIDER_SITE_OTHER): Payer: Medicare HMO | Admitting: Family Medicine

## 2018-04-26 ENCOUNTER — Encounter: Payer: Self-pay | Admitting: Family Medicine

## 2018-04-26 VITALS — BP 142/88 | HR 82 | Temp 98.1°F | Wt 196.4 lb

## 2018-04-26 DIAGNOSIS — J069 Acute upper respiratory infection, unspecified: Secondary | ICD-10-CM | POA: Diagnosis not present

## 2018-04-26 MED ORDER — BENZONATATE 200 MG PO CAPS: 200.0000 mg | ORAL_CAPSULE | Freq: Three times a day (TID) | ORAL | 0 refills | Status: DC | PRN

## 2018-04-26 NOTE — Progress Notes (Signed)
   Subjective:    Patient ID: Alexandra Bowen, female    DOB: Sep 07, 1935, 82 y.o.   MRN: [id number]  HPI She has a 3-day history of started with a dry cough, rhinorrhea, PND but no fever, chills, sore throat, earache, shortness of breath   Review of Systems     Objective:   Physical Exam Alert and in no distress. Tympanic membranes and canals are normal. Pharyngeal area is normal. Neck is supple without adenopathy or thyromegaly. Cardiac exam shows a regular sinus rhythm without murmurs or gallops. Lungs are clear to auscultation.        Assessment & Plan:  URI, acute - Plan: benzonatate (TESSALON) 200 MG capsule Explains that her symptoms might worsen she have a productive cough in a day or 2 and this is to be expected.  Explained the fact that it can take 7 to 10 days for this to run the course.  She can also use NyQuil at night.

## 2018-04-26 NOTE — Patient Instructions (Signed)
Try Nyquil at night with the Tessalon Upper Respiratory Infection, Adult An upper respiratory infection (URI) affects the nose, throat, and upper air passages. URIs are caused by germs (viruses). The most common type of URI is often called "the common cold." Medicines cannot cure URIs, but you can do things at home to relieve your symptoms. URIs usually get better within 7-10 days. Follow these instructions at home: Activity  Rest as needed.  If you have a fever, stay home from work or school until your fever is gone, or until your doctor says you may return to work or school. ? You should stay home until you cannot spread the infection anymore (you are not contagious). ? Your doctor may have you wear a face mask so you have less risk of spreading the infection. Relieving symptoms  Gargle with a salt-water mixture 3-4 times a day or as needed. To make a salt-water mixture, completely dissolve -1 tsp of salt in 1 cup of warm water.  Use a cool-mist humidifier to add moisture to the air. This can help you breathe more easily. Eating and drinking   Drink enough fluid to keep your pee (urine) pale yellow.  Eat soups and other clear broths. General instructions   Take over-the-counter and prescription medicines only as told by your doctor. These include cold medicines, fever reducers, and cough suppressants.  Do not use any products that contain nicotine or tobacco. These include cigarettes and e-cigarettes. If you need help quitting, ask your doctor.  Avoid being where people are smoking (avoid secondhand smoke).  Make sure you get regular shots and get the flu shot every year.  Keep all follow-up visits as told by your doctor. This is important. How to avoid spreading infection to others   Wash your hands often with soap and water. If you do not have soap and water, use hand sanitizer.  Avoid touching your mouth, face, eyes, or nose.  Cough or sneeze into a tissue or your  sleeve or elbow. Do not cough or sneeze into your hand or into the air. Contact a doctor if:  You are getting worse, not better.  You have any of these: ? A fever. ? Chills. ? Brown or red mucus in your nose. ? Yellow or brown fluid (discharge)coming from your nose. ? Pain in your face, especially when you bend forward. ? Swollen neck glands. ? Pain with swallowing. ? White areas in the back of your throat. Get help right away if:  You have shortness of breath that gets worse.  You have very bad or constant: ? Headache. ? Ear pain. ? Pain in your forehead, behind your eyes, and over your cheekbones (sinus pain). ? Chest pain.  You have long-lasting (chronic) lung disease along with any of these: ? Wheezing. ? Long-lasting cough. ? Coughing up blood. ? A change in your usual mucus.  You have a stiff neck.  You have changes in your: ? Vision. ? Hearing. ? Thinking. ? Mood. Summary  An upper respiratory infection (URI) is caused by a germ called a virus. The most common type of URI is often called "the common cold."  URIs usually get better within 7-10 days.  Take over-the-counter and prescription medicines only as told by your doctor. This information is not intended to replace advice given to you by your health care provider. Make sure you discuss any questions you have with your health care provider. Document Released: 10/01/2007 Document Revised: 12/05/2016 Document Reviewed: 12/05/2016 Elsevier  Interactive Patient Education  [employer].

## 2019-01-28 ENCOUNTER — Other Ambulatory Visit: Payer: Self-pay

## 2019-01-28 ENCOUNTER — Encounter: Payer: Self-pay | Admitting: Family Medicine

## 2019-01-28 ENCOUNTER — Ambulatory Visit (INDEPENDENT_AMBULATORY_CARE_PROVIDER_SITE_OTHER): Payer: Medicare HMO | Admitting: Family Medicine

## 2019-01-28 VITALS — BP 142/86 | HR 86 | Temp 99.1°F | Ht 65.0 in | Wt 193.0 lb

## 2019-01-28 DIAGNOSIS — M8580 Other specified disorders of bone density and structure, unspecified site: Secondary | ICD-10-CM

## 2019-01-28 DIAGNOSIS — R7309 Other abnormal glucose: Secondary | ICD-10-CM | POA: Diagnosis not present

## 2019-01-28 DIAGNOSIS — D692 Other nonthrombocytopenic purpura: Secondary | ICD-10-CM | POA: Diagnosis not present

## 2019-01-28 DIAGNOSIS — Z96651 Presence of right artificial knee joint: Secondary | ICD-10-CM | POA: Diagnosis not present

## 2019-01-28 DIAGNOSIS — Z8249 Family history of ischemic heart disease and other diseases of the circulatory system: Secondary | ICD-10-CM | POA: Diagnosis not present

## 2019-01-28 DIAGNOSIS — Z23 Encounter for immunization: Secondary | ICD-10-CM

## 2019-01-28 DIAGNOSIS — I10 Essential (primary) hypertension: Secondary | ICD-10-CM | POA: Diagnosis not present

## 2019-01-28 DIAGNOSIS — E7439 Other disorders of intestinal carbohydrate absorption: Secondary | ICD-10-CM

## 2019-01-28 DIAGNOSIS — Z85828 Personal history of other malignant neoplasm of skin: Secondary | ICD-10-CM

## 2019-01-28 DIAGNOSIS — Z87442 Personal history of urinary calculi: Secondary | ICD-10-CM

## 2019-01-28 DIAGNOSIS — I4891 Unspecified atrial fibrillation: Secondary | ICD-10-CM | POA: Diagnosis not present

## 2019-01-28 DIAGNOSIS — M1990 Unspecified osteoarthritis, unspecified site: Secondary | ICD-10-CM

## 2019-01-28 DIAGNOSIS — E669 Obesity, unspecified: Secondary | ICD-10-CM | POA: Diagnosis not present

## 2019-01-28 DIAGNOSIS — Z9849 Cataract extraction status, unspecified eye: Secondary | ICD-10-CM

## 2019-01-28 DIAGNOSIS — R9431 Abnormal electrocardiogram [ECG] [EKG]: Secondary | ICD-10-CM

## 2019-01-28 DIAGNOSIS — E785 Hyperlipidemia, unspecified: Secondary | ICD-10-CM | POA: Diagnosis not present

## 2019-01-28 DIAGNOSIS — I499 Cardiac arrhythmia, unspecified: Secondary | ICD-10-CM

## 2019-01-28 DIAGNOSIS — Z0000 Encounter for general adult medical examination without abnormal findings: Secondary | ICD-10-CM | POA: Diagnosis not present

## 2019-01-28 MED ORDER — AMLODIPINE BESYLATE 5 MG PO TABS: ORAL_TABLET | ORAL | 3 refills | Status: DC

## 2019-01-28 MED ORDER — RIVAROXABAN 20 MG PO TABS: 20.0000 mg | ORAL_TABLET | Freq: Every day | ORAL | 3 refills | Status: DC

## 2019-01-28 MED ORDER — SIMVASTATIN 20 MG PO TABS: ORAL_TABLET | ORAL | 3 refills | Status: DC

## 2019-01-28 MED ORDER — BISOPROLOL-HYDROCHLOROTHIAZIDE 10-6.25 MG PO TABS: 1.0000 | ORAL_TABLET | Freq: Every day | ORAL | 3 refills | Status: DC

## 2019-01-28 NOTE — Progress Notes (Signed)
Alexandra Bowen is a 83 y.o. female who presents for annual wellness visit,CPE and follow-up on chronic medical conditions.  She hypertension and is taking Ziac as well as amlodipine and having no difficulty with that.  She is also taking Zocor for her cholesterol with no aches or pains.  Her allergies seem to be under good control.  The arthritis causes very little difficulty and she does occasionally use Advil.  She has done fairly well with her TKA but still does have some occasional difficulty with that.  This does interfere with her ADLs.  She does have a history of osteopenia but is not interested in the DEXA scan.  The previous abnormal EKG was evaluated in 2017.  Since then she has had no chest pain, shortness of breath PND.  She does have a remote history of renal stones and has had a BCE removed.  She has also had previous cataract surgery.  Immunizations and Health Maintenance Immunization History  Administered Date(s) Administered  . Influenza Split 02/15/1996, 02/22/2007, 02/11/2011, 02/10/2012  . Influenza Whole 01/12/2008, 02/14/2009, 02/25/2010  . Influenza, High Dose Seasonal PF 02/03/2013, 02/06/2014, 01/22/2015, 02/05/2016, 02/02/2017, 01/26/2018  . Pneumococcal Conjugate-13 12/13/2015  . Pneumococcal Polysaccharide-23 08/25/2005, 12/06/2012  . Td 07/26/1996  . Tdap 11/04/2006, 01/22/2017  . Zoster 08/25/2005  . Zoster Recombinat (Shingrix) 01/22/2017   Health Maintenance Due  Topic Date Due  . INFLUENZA VACCINE  11/27/2018    Last Pap smear: aged out  Last mammogram: 08/24/17 Last colonoscopy:04/28/2005 Last DEXA:05/31/10 Dentist: 09/2018 Ophtho: 02/2018 Exercise: bike, yard work, walking  Other doctors caring for patient include: Turner.  Byerly.  Advanced directives: none on file copy asked for    Depression screen:  See questionnaire below.  Depression screen [facility] 2/9 01/26/2018 01/20/2017 12/13/2015 12/11/2014 12/06/2012  Decreased Interest 0 0 0 0 0  Down, Depressed,  Hopeless 0 0 0 0 0  PHQ - 2 Score 0 0 0 0 0    Fall Risk Screen: see questionnaire below. Fall Risk  01/26/2018 01/20/2017 12/13/2015 12/11/2014 12/07/2013  Falls in the past year? No No No No No    ADL screen:  See questionnaire below Functional Status Survey:     Review of Systems Constitutional: -, -unexpected weight change, -anorexia, -fatigue Allergy: -sneezing, -itching, -congestion Dermatology: denies changing moles, rash, lumps ENT: -runny nose, -ear pain, -sore throat,  Cardiology:  -chest pain, -palpitations, -orthopnea, Respiratory: -cough, -shortness of breath, -dyspnea on exertion, -wheezing,  Gastroenterology: -abdominal pain, -nausea, -vomiting, -diarrhea, -constipation, -dysphagia Hematology: -bleeding or bruising problems Musculoskeletal: -arthralgias, -myalgias, -joint swelling, -back pain, - Ophthalmology: -vision changes,  Urology: -dysuria, -difficulty urinating,  -urinary frequency, -urgency, incontinence Neurology: -, -numbness, , -memory loss, -falls, -dizziness    PHYSICAL EXAM:   General Appearance: Alert, cooperative, no distress, appears stated age Head: Normocephalic, without obvious abnormality, atraumatic Eyes: PERRL, conjunctiva/corneas clear, EOM's intact, fundi benign Ears: Normal TM's and external ear canals Nose: Nares normal, mucosa normal, no drainage or sinus tenderness Throat: Lips, mucosa, and tongue normal; teeth and gums normal Neck: Supple, no lymphadenopathy;  thyroid:  no enlargement/tenderness/nodules; no carotid bruit or JVD Lungs: Clear to auscultation bilaterally without wheezes, rales or ronchi; respirations unlabored Heart: Irregular rhythm, S1 and S2 normal, no murmur, rubor gallop Abdomen: Soft, non-tender, nondistended, normoactive bowel sounds,  no masses, no hepatosplenomegaly Extremities: No clubbing, cyanosis or edema Pulses: 2+ and symmetric all extremities Skin: Purpuric lesions noted on forearms. Lymph nodes:  Cervical, supraclavicular, and axillary nodes normal Neurologic:  CNII-XII intact, normal strength, sensation and gait; reflexes 2+ and symmetric throughout Psych: Normal mood, affect, hygiene and grooming. EKG shows evidence of fib /flutter ASSESSMENT/PLAN: Routine general medical examination at a health care facility - Plan: CBC with Differential/Platelet, Comprehensive metabolic panel, Lipid panel  Need for influenza vaccination - Plan: Flu Vaccine QUAD High Dose(Fluad)  Obesity (BMI 30-39.9) - Plan: CBC with Differential/Platelet, Comprehensive metabolic panel, Lipid panel     I discussed diet and exercise with her.  She will continue her regular exercise pattern. Essential hypertension - Plan: CBC with Differential/Platelet,   Comprehensive metabolic panel, bisoprolol-hydrochlorothiazide (ZIAC) 10-6.25 MG tablet, amLODipine (NORVASC) 5 MG tablet  Hyperlipidemia LDL goal <100 - Plan: simvastatin (ZOCOR) 20 MG tablet  Arthritis    Instructed to stop using Advil and aspirin. Osteopenia, unspecified location    Patient not interested in repeat DEXA. Family history of heart disease in female family member before age 71 - Plan: CBC with Differential/Platelet, Comprehensive metabolic panel, Lipid panel  History of kidney stones  History of cataract extraction, unspecified laterality  S/P right TKA Encouraged her to continue with physical therapy. Abnormal EKG - Plan: EKG 12-Lead  History of basal cell carcinoma  Glucose intolerance - Plan: Comprehensive metabolic panel  Cardiac arrhythmia, unspecified cardiac arrhythmia type - Plan: EKG 12-Lead  Senile purpura (HCC)   I explained that this is part of the aging process and nothing needs to be done. Atrial fibrillation, unspecified type ([place]) - Plan: rivaroxaban (XARELTO) 20 MG TABS tablet, Ambulatory referral to Cardiology  at least 30 minutes of aerobic activity at least 5 days/week and weight-bearing exercise 2x/week; proper  sunscreen use reviewed; healthy diet, including goals of calcium and vitamin D intake and alcohol recommendations (less than or equal to 1 drink/day) reviewed; regular seatbelt use; changing batteries in smoke detectors.  Immunization recommendations discussed.    Medicare Attestation I have personally reviewed: The patient's medical and social history Their use of alcohol, tobacco or illicit drugs Their current medications and supplements The patient's functional ability including ADLs,fall risks, home safety risks, cognitive, and hearing and visual impairment Diet and physical activities Evidence for depression or mood disorders  The patient's weight, height, and BMI have been recorded in the chart.  I have made referrals, counseling, and provided education to the patient based on review of the above and I have provided the patient with a written personalized care plan for preventive services.     [name], MD   01/28/2019

## 2019-01-28 NOTE — Patient Instructions (Signed)
  Alexandra Bowen , Thank you for taking time to come for your Medicare Wellness Visit. I appreciate your ongoing commitment to your health goals. Please review the following plan we discussed and let me know if I can assist you in the future.   These are the goals we discussed: Continue with exercise.  Stop taking aspirin and ibuprofen.  Referral will be made to cardiology.  Patient started on Xarelto.  Discussed the need for this for the rest of her life to help prevent stroke. This is a list of the screening recommended for you and due dates:  Health Maintenance  Topic Date Due  . Flu Shot  11/27/2018  . Tetanus Vaccine  01/23/2027  . DEXA scan (bone density measurement)  Completed  . Pneumonia vaccines  Completed

## 2019-01-29 LAB — CBC WITH DIFFERENTIAL/PLATELET
Basophils Absolute: 0 10*3/uL (ref 0.0–0.2)
Basos: 0 %
EOS (ABSOLUTE): 0.3 10*3/uL (ref 0.0–0.4)
Eos: 3 %
Hematocrit: 44.9 % (ref 34.0–46.6)
Hemoglobin: 14.7 g/dL (ref 11.1–15.9)
Immature Grans (Abs): 0 10*3/uL (ref 0.0–0.1)
Immature Granulocytes: 0 %
Lymphocytes Absolute: 2 10*3/uL (ref 0.7–3.1)
Lymphs: 20 %
MCH: 26.8 pg (ref 26.6–33.0)
MCHC: 32.7 g/dL (ref 31.5–35.7)
MCV: 82 fL (ref 79–97)
Monocytes Absolute: 0.7 10*3/uL (ref 0.1–0.9)
Monocytes: 7 %
Neutrophils Absolute: 7 10*3/uL (ref 1.4–7.0)
Neutrophils: 70 %
Platelets: 199 10*3/uL (ref 150–450)
RBC: 5.48 x10E6/uL — ABNORMAL HIGH (ref 3.77–5.28)
RDW: 13 % (ref 11.7–15.4)
WBC: 10 10*3/uL (ref 3.4–10.8)

## 2019-01-29 LAB — COMPREHENSIVE METABOLIC PANEL
ALT: 18 IU/L (ref 0–32)
AST: 24 IU/L (ref 0–40)
Albumin/Globulin Ratio: 1.8 (ref 1.2–2.2)
Albumin: 4.6 g/dL (ref 3.6–4.6)
Alkaline Phosphatase: 149 IU/L — ABNORMAL HIGH (ref 39–117)
BUN/Creatinine Ratio: 19 (ref 12–28)
BUN: 16 mg/dL (ref 8–27)
Bilirubin Total: 1 mg/dL (ref 0.0–1.2)
CO2: 25 mmol/L (ref 20–29)
Calcium: 9.8 mg/dL (ref 8.7–10.3)
Chloride: 99 mmol/L (ref 96–106)
Creatinine, Ser: 0.86 mg/dL (ref 0.57–1.00)
GFR calc Af Amer: 73 mL/min/{1.73_m2} (ref >59)
GFR calc non Af Amer: 64 mL/min/{1.73_m2} (ref >59)
Globulin, Total: 2.5 g/dL (ref 1.5–4.5)
Glucose: 120 mg/dL — ABNORMAL HIGH (ref 65–99)
Potassium: 4.1 mmol/L (ref 3.5–5.2)
Sodium: 144 mmol/L (ref 134–144)
Total Protein: 7.1 g/dL (ref 6.0–8.5)

## 2019-01-29 LAB — LIPID PANEL
Chol/HDL Ratio: 2.5 ratio (ref 0.0–4.4)
Cholesterol, Total: 141 mg/dL (ref 100–199)
HDL: 57 mg/dL (ref >39)
LDL Chol Calc (NIH): 69 mg/dL (ref 0–99)
Triglycerides: 75 mg/dL (ref 0–149)
VLDL Cholesterol Cal: 15 mg/dL (ref 5–40)

## 2019-02-02 LAB — SPECIMEN STATUS REPORT

## 2019-02-02 LAB — HGB A1C W/O EAG: Hgb A1c MFr Bld: 5.9 % — ABNORMAL HIGH (ref 4.8–5.6)

## 2019-03-08 ENCOUNTER — Other Ambulatory Visit: Payer: Self-pay

## 2019-03-08 ENCOUNTER — Ambulatory Visit (INDEPENDENT_AMBULATORY_CARE_PROVIDER_SITE_OTHER): Payer: Medicare HMO | Admitting: Family Medicine

## 2019-03-08 VITALS — BP 145/75 | Temp 96.9°F | Wt 193.0 lb

## 2019-03-08 DIAGNOSIS — J069 Acute upper respiratory infection, unspecified: Secondary | ICD-10-CM

## 2019-03-08 NOTE — Progress Notes (Signed)
   Subjective:    Patient ID: Alexandra Bowen, female    DOB: 04-30-1935, 83 y.o.   MRN: [id number]  HPI Interactive audio and video telecommunications were attempted between this provider and patient, however she did not have access to video capability.  We continued and completed visit with audio only. The patient was located at home. The provider was located in the office. The patient did consent to this visit and is aware of possible charges through their insurance for this visit. The other persons participating in this telemedicine service were none. Time spent on call was 8 minutes  This virtual service is not related to other E/M service within previous 7 days. She complains of a 2-day history of sore throat, cough, rhinorrhea, hoarse voice and nasal congestion.  No fever, chills, smell or taste changes.  She apparently usually gets discomfort problem this time a year.    Review of Systems     Objective:   Physical Exam Alert and in no distress otherwise not examined       Assessment & Plan:  URI, acute I explained that I think this is mainly URI.  Do not really suspect Covid.  Recommend Robitussin-DM during the day and NyQuil at night.  She will call if continued difficulty.  She was comfortable with that.

## 2019-03-11 ENCOUNTER — Ambulatory Visit: Payer: Medicare HMO | Admitting: Cardiology

## 2019-05-19 ENCOUNTER — Telehealth: Payer: Self-pay | Admitting: Family Medicine

## 2019-05-19 NOTE — Telephone Encounter (Signed)
She can get the Covid but will probably need to apply pressure to the area a little bit longer to prevent any bleeding.

## 2019-05-19 NOTE — Telephone Encounter (Signed)
Pt called and is wanting to know if she can get the COVID shot while she is taking the RX xarelto pt can be reached at [phone]

## 2019-05-20 NOTE — Telephone Encounter (Signed)
Pt was advised K[facility] 

## 2019-06-01 NOTE — Progress Notes (Addendum)
Cardiology Office Consult  Note:    Date:  06/02/2019   ID:  Alexandra Bowen, DOB [birth year], MRN [id number]  PCP:  Denita Lung, MD  Cardiologist:  Fransico Him, MD    Referring MD: Denita Lung, MD   Chief Complaint  Patient presents with  . Atrial Fibrillation    History of Present Illness:    Alexandra Bowen is a 84 y.o. female with a hx of HTN, Fm Hx of CAD and HLD who is referred here today for evaluation of palpitations by her PCP [name], MD  She was recently seen for a wellness exam in Oct 2020 and was found to be in atrial fibrillation.  She was started on Xarelto 20mg  daily and referred to Cardiology.  She is here today for evaluation.  She denies any chest pain or pressure, SOB, DOE, PND, orthopnea, dizziness, palpitations or syncope. She has LE edema only when she has been standing too long.  She is compliant with her meds and is tolerating meds with no SE.    Past Medical History:  Diagnosis Date  . Allergy   . Arthritis   . Basal cell carcinoma (BCC) of nasal tip 02/04/2017   per path report  . Breast cancer ([place])   . 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 Dyslipidemia   . Glucose intolerance (impaired glucose tolerance)   . Hypertension    WHITE COAT SYNDROME  . Obesity   . Renal stone     Past Surgical History:  Procedure Laterality Date  . BREAST LUMPECTOMY WITH NEEDLE LOCALIZATION Left 10/06/2012   Procedure: LEFT BREAST LUMPECTOMY WITH NEEDLE LOCALIZATION;  Surgeon: Stark Klein, MD;  Location: [facility];  Service: General;  Laterality: Left;  needle localization at 10:30 breast center of [place]   . BREAST SURGERY Left 10/06/2012   NL Lumpectomy  . BUNIONECTOMY     both feet  . [place]   routine-neg  . CATARACT EXTRACTION, BILATERAL    . COLONOSCOPY    . DILATION AND CURETTAGE OF UTERUS  1998  . HAMMER TOE SURGERY     both feet  . TOTAL KNEE ARTHROPLASTY Right 05/27/2016    Procedure: RIGHT TOTAL KNEE ARTHROPLASTY;  Surgeon: Paralee Cancel, MD;  Location: [facility];  Service: Orthopedics;  Laterality: Right;    Current Medications: Current Meds  Medication Sig  . acetaminophen (TYLENOL) 500 MG tablet Take 500 mg by mouth every 6 (six) hours as needed.  Marland Kitchen amLODipine (NORVASC) 5 MG tablet TAKE 1 TABLET(5 MG) BY MOUTH DAILY  . bisoprolol-hydrochlorothiazide (ZIAC) 10-6.25 MG tablet Take 1 tablet by mouth daily.  . Calcium Carb-Cholecalciferol (CALCIUM 600+D3 PO) Take 1 tablet by mouth daily.  Marland Kitchen docusate sodium (COLACE) 100 MG capsule Take 1 capsule (100 mg total) by mouth 2 (two) times daily.  . Multiple Vitamins-Minerals (MULTIVITAMIN WITH MINERALS) tablet Take 1 tablet by mouth daily.    . rivaroxaban (XARELTO) 20 MG TABS tablet Take 1 tablet (20 mg total) by mouth daily with supper.  . simvastatin (ZOCOR) 20 MG tablet TAKE 1 TABLET(20 MG) BY MOUTH AT BEDTIME     Allergies:   Patient has no known allergies.   Social History   Socioeconomic History  . Marital status: Widowed    Spouse name: Not on file  . Number of children: Not on file  . Years of education: Not on file  . Highest education  level: Not on file  Occupational History  . Not on file  Tobacco Use  . Smoking status: Never Smoker  . Smokeless tobacco: Never Used  Substance and Sexual Activity  . Alcohol use: No  . Drug use: No  . Sexual activity: Not Currently  Other Topics Concern  . Not on file  Social History Narrative  . Not on file   Social Determinants of Health   Financial Resource Strain:   . Difficulty of Paying Living Expenses: Not on file  Food Insecurity:   . Worried About Charity fundraiser in the Last Year: Not on file  . Ran Out of Food in the Last Year: Not on file  Transportation Needs:   . Lack of Transportation (Medical): Not on file  . Lack of Transportation (Non-Medical): Not on file  Physical Activity:   . Days of Exercise per Week: Not on file  . Minutes  of Exercise per Session: Not on file  Stress:   . Feeling of Stress : Not on file  Social Connections:   . Frequency of Communication with Friends and Family: Not on file  . Frequency of Social Gatherings with Friends and Family: Not on file  . Attends Religious Services: Not on file  . Active Member of Clubs or Organizations: Not on file  . Attends Archivist Meetings: Not on file  . Marital Status: Not on file     Family History: The patient's family history includes CVA in her brother; Diabetes in her mother; Heart disease in her brother, brother, brother, and sister; Lung cancer in her brother, sister, and sister; Throat cancer in her father.  ROS:   Please see the history of present illness.    ROS  All other systems reviewed and negative.   EKGs/Labs/Other Studies Reviewed:    The following studies were reviewed today: EKG, labs, office notes  EKG:  EKG is  ordered today.  The ekg ordered today demonstrates atrial fibrilltion at 78bpm with nonspecific ST abnormality and septal infarct  Recent Labs: 01/28/2019: ALT 18; BUN 16; Creatinine, Ser 0.86; Hemoglobin 14.7; Platelets 199; Potassium 4.1; Sodium 144   Recent Lipid Panel    Component Value Date/Time   CHOL 141 01/28/2019 1127   TRIG 75 01/28/2019 1127   HDL 57 01/28/2019 1127   CHOLHDL 2.5 01/28/2019 1127   CHOLHDL 2.6 01/20/2017 1442   VLDL 21 12/13/2015 0001   LDLCALC 69 01/28/2019 1127   LDLCALC 78 01/20/2017 1442    Physical Exam:    VS:  BP (!) 142/86   Pulse 78   Ht 5\' 5"  (1.651 m)   Wt 195 lb 6.4 oz (88.6 kg)   BMI 32.52 kg/m     Wt Readings from Last 3 Encounters:  06/02/19 195 lb 6.4 oz (88.6 kg)  03/08/19 193 lb (87.5 kg)  01/28/19 193 lb (87.5 kg)     GEN:  Well nourished, well developed in no acute distress HEENT: Normal NECK: No JVD; No carotid bruits LYMPHATICS: No lymphadenopathy CARDIAC: irregularly irregular, no murmurs, rubs, gallops RESPIRATORY:  Clear to  auscultation without rales, wheezing or rhonchi  ABDOMEN: Soft, non-tender, non-distended MUSCULOSKELETAL:  No edema; No deformity  SKIN: Warm and dry NEUROLOGIC:  Alert and oriented x 3 PSYCHIATRIC:  Normal affect   ASSESSMENT:    1. New onset atrial fibrillation ([place])   2. Essential hypertension   3. Hyperlipidemia LDL goal <100    PLAN:    In order of  problems listed above:  1.  New onset atrial fibrillation -first dx in 01/2019 -she is asymptomatic and remains in afib with controlled VR -denies any bleeding problems on DOAC -continue Xarelto 20mg  daily and Bisoprolol HCT -check 2D echo to assess for structural heart disease -we discussed rate vs rhythm control and she is asymptomatic so will pursue rate control since she has been in it for at least 5 months. -Creatinine is normal at 0.86  2.  HTN -BP controlled -continue Amlodipine 5mg  daily and Bisoprolol-HCT 10-6.25mg  daily  3.  HLD -followed by PCP -continue simvastatin 20mg  daily  Medication Adjustments/Labs and Tests Ordered: Current medicines are reviewed at length with the patient today.  Concerns regarding medicines are outlined above.  Orders Placed This Encounter  Procedures  . EKG 12-Lead   No orders of the defined types were placed in this encounter.   Signed, Fransico Him, MD  06/02/2019 3:07 PM    [facility]

## 2019-06-02 ENCOUNTER — Other Ambulatory Visit: Payer: Self-pay

## 2019-06-02 ENCOUNTER — Ambulatory Visit: Payer: Medicare HMO | Admitting: Cardiology

## 2019-06-02 ENCOUNTER — Encounter: Payer: Self-pay | Admitting: Cardiology

## 2019-06-02 VITALS — BP 142/86 | HR 78 | Ht 65.0 in | Wt 195.4 lb

## 2019-06-02 DIAGNOSIS — I10 Essential (primary) hypertension: Secondary | ICD-10-CM | POA: Diagnosis not present

## 2019-06-02 DIAGNOSIS — E785 Hyperlipidemia, unspecified: Secondary | ICD-10-CM

## 2019-06-02 DIAGNOSIS — I4891 Unspecified atrial fibrillation: Secondary | ICD-10-CM | POA: Diagnosis not present

## 2019-06-02 NOTE — Patient Instructions (Signed)
Medication Instructions:   Your physician recommends that you continue on your current medications as directed. Please refer to the Current Medication list given to you today.  *If you need a refill on your cardiac medications before your next appointment, please call your pharmacy*  .  Testing/Procedures:  Your physician has requested that you have an echocardiogram. Echocardiography is a painless test that uses sound waves to create images of your heart. It provides your doctor with information about the size and shape of your heart and how well your heart's chambers and valves are working. This procedure takes approximately one hour. There are no restrictions for this procedure.    Follow-Up: At [facility], you and your health needs are our priority.  As part of our continuing mission to provide you with exceptional heart care, we have created designated Provider Care Teams.  These Care Teams include your primary Cardiologist (physician) and Advanced Practice Providers (APPs -  Physician Assistants and Nurse Practitioners) who all work together to provide you with the care you need, when you need it.  Your next appointment:   6 months  The format for your next appointment:   In Person  Provider:   Traci Turner, MD    

## 2019-06-15 ENCOUNTER — Telehealth (HOSPITAL_COMMUNITY): Payer: Self-pay | Admitting: Family Medicine

## 2019-06-15 NOTE — Telephone Encounter (Signed)
Called and left message for patient to call office, we need to reschedule Echo for 06/16/2019 due to [place].

## 2019-06-16 ENCOUNTER — Other Ambulatory Visit (HOSPITAL_COMMUNITY): Payer: Medicare HMO

## 2019-06-24 ENCOUNTER — Ambulatory Visit (HOSPITAL_COMMUNITY): Payer: Medicare HMO | Attending: Internal Medicine

## 2019-06-24 ENCOUNTER — Other Ambulatory Visit: Payer: Self-pay

## 2019-06-24 DIAGNOSIS — I4891 Unspecified atrial fibrillation: Secondary | ICD-10-CM

## 2019-06-30 ENCOUNTER — Telehealth: Payer: Self-pay

## 2019-06-30 NOTE — Telephone Encounter (Signed)
-----   Message from Sueanne Margarita, MD sent at 06/25/2019  6:21 PM EST ----- Echo showed normal heart function with mildly thickened heart muscle, severely enlarged LA and RA, mildly leaky MV and mild to moderately leaky TV. There is moderately elevated BP in the in the arteries of the lungs.  Compared to prior echo 2018, The PASP has increased from 33 to 72mmHg.  Please order a VQ to rule out chronic thromboembolic dz, PFTs with DLCO and home sleep study ([place]) to rule out sleep apnea as causes of pulmonary HTN.

## 2019-07-12 ENCOUNTER — Telehealth: Payer: Self-pay

## 2019-07-12 NOTE — Telephone Encounter (Signed)
Spoke with patient and informed her for reasoning of the need for testing per Dr. Radford Pax along with consequences of not receiving treatment if needed. Patient verbalized understanding but declined to have further tests done and thanked me for keeping her informed.

## 2019-07-12 NOTE — Telephone Encounter (Signed)
-----   Message from Sueanne Margarita, MD sent at 07/01/2019 10:39 AM EST ----- Please let her know that if she has chronic undiagnosed blood clots in her lungs, COPD or sleep apnea, her pulmonary HTN will continue to progress and results in CHF and become irreversible without proper treatment  Traci ----- Message ----- From: Antonieta Iba, RN Sent: 06/30/2019  11:28 AM EST To: Sueanne Margarita, MD  The patient has been notified of the result and verbalized understanding.  All questions (if any) were answered. Patient states that she does not want to do any further testing. Patient has been educated on pulmonary hypertension and reason for examinations. Patient states that she is feeling good and would not like to proceed at this time.  Antonieta Iba, RN 06/30/2019 11:27 AM

## 2020-01-24 ENCOUNTER — Other Ambulatory Visit: Payer: Self-pay | Admitting: Family Medicine

## 2020-01-24 DIAGNOSIS — I4891 Unspecified atrial fibrillation: Secondary | ICD-10-CM

## 2020-01-26 ENCOUNTER — Telehealth: Payer: Self-pay

## 2020-01-26 NOTE — Telephone Encounter (Signed)
Pt. Called stating her [place] is Monday 01/30/20 at 1:30 p.m. and she wanted to come in that morning to do her fasting labs I put her down at 9:45 that same morning if you could put the order in for that.

## 2020-01-30 ENCOUNTER — Other Ambulatory Visit: Payer: Self-pay

## 2020-01-30 ENCOUNTER — Encounter: Payer: Self-pay | Admitting: Family Medicine

## 2020-01-30 ENCOUNTER — Ambulatory Visit (INDEPENDENT_AMBULATORY_CARE_PROVIDER_SITE_OTHER): Payer: Medicare Other | Admitting: Family Medicine

## 2020-01-30 ENCOUNTER — Other Ambulatory Visit: Payer: Medicare HMO

## 2020-01-30 VITALS — BP 162/70 | HR 89 | Temp 98.5°F | Ht 64.0 in | Wt 195.2 lb

## 2020-01-30 DIAGNOSIS — E669 Obesity, unspecified: Secondary | ICD-10-CM

## 2020-01-30 DIAGNOSIS — E7439 Other disorders of intestinal carbohydrate absorption: Secondary | ICD-10-CM

## 2020-01-30 DIAGNOSIS — M1990 Unspecified osteoarthritis, unspecified site: Secondary | ICD-10-CM | POA: Diagnosis not present

## 2020-01-30 DIAGNOSIS — E785 Hyperlipidemia, unspecified: Secondary | ICD-10-CM

## 2020-01-30 DIAGNOSIS — I4891 Unspecified atrial fibrillation: Secondary | ICD-10-CM | POA: Diagnosis not present

## 2020-01-30 DIAGNOSIS — Z8249 Family history of ischemic heart disease and other diseases of the circulatory system: Secondary | ICD-10-CM

## 2020-01-30 DIAGNOSIS — Z9849 Cataract extraction status, unspecified eye: Secondary | ICD-10-CM

## 2020-01-30 DIAGNOSIS — Z87442 Personal history of urinary calculi: Secondary | ICD-10-CM | POA: Diagnosis not present

## 2020-01-30 DIAGNOSIS — I10 Essential (primary) hypertension: Secondary | ICD-10-CM

## 2020-01-30 DIAGNOSIS — C50411 Malignant neoplasm of upper-outer quadrant of right female breast: Secondary | ICD-10-CM

## 2020-01-30 DIAGNOSIS — M8580 Other specified disorders of bone density and structure, unspecified site: Secondary | ICD-10-CM

## 2020-01-30 DIAGNOSIS — Z23 Encounter for immunization: Secondary | ICD-10-CM | POA: Diagnosis not present

## 2020-01-30 DIAGNOSIS — Z0000 Encounter for general adult medical examination without abnormal findings: Secondary | ICD-10-CM | POA: Diagnosis not present

## 2020-01-30 DIAGNOSIS — D692 Other nonthrombocytopenic purpura: Secondary | ICD-10-CM | POA: Diagnosis not present

## 2020-01-30 DIAGNOSIS — R7309 Other abnormal glucose: Secondary | ICD-10-CM | POA: Diagnosis not present

## 2020-01-30 MED ORDER — AMLODIPINE BESYLATE 5 MG PO TABS: ORAL_TABLET | ORAL | 3 refills | Status: DC

## 2020-01-30 MED ORDER — BISOPROLOL-HYDROCHLOROTHIAZIDE 10-6.25 MG PO TABS: 1.0000 | ORAL_TABLET | Freq: Every day | ORAL | 3 refills | Status: DC

## 2020-01-30 MED ORDER — RIVAROXABAN 20 MG PO TABS: ORAL_TABLET | ORAL | 5 refills | Status: DC

## 2020-01-30 MED ORDER — SIMVASTATIN 20 MG PO TABS: ORAL_TABLET | ORAL | 3 refills | Status: DC

## 2020-01-30 NOTE — Patient Instructions (Signed)
  Alexandra Bowen , Thank you for taking time to come for your Medicare Wellness Visit. I appreciate your ongoing commitment to your health goals. Please review the following plan we discussed and let me know if I can assist you in the future.   These are the goals we discussed: Call your insurance company and ask them what other drug that similar to Xarelto if you can get cheaper This is a list of the screening recommended for you and due dates:  Health Maintenance  Topic Date Due  . Flu Shot  11/27/2019  . Tetanus Vaccine  01/23/2027  . DEXA scan (bone density measurement)  Completed  . COVID-19 Vaccine  Completed  . Pneumonia vaccines  Completed

## 2020-01-30 NOTE — Progress Notes (Signed)
Alexandra Bowen is a 84 y.o. female who presents for annual wellness visit,CPEand follow-up on chronic medical conditions.  She does have a history of atrial fibrillation and presently is on Xarelto. The cough that medicine has gone up tremendously which has her distraught. She continues on amlodipine, Ziac and having no difficulty. Simvastatin is causing no aches or pains. She is exercising regularly. She does not smoke. She does have a history of osteopenia but is not interested in follow-up DEXA scan. No recent history of kidney stones. She does have a history of arthritis but does not have any complaints about that. She also has a history of breast cancer dating to 2014. She has had cataract surgery. Also does have a previous history of glucose intolerance. Family and social history was reviewed. Immunizations and Health Maintenance Immunization History  Administered Date(s) Administered  . Fluad Quad(high Dose 65+) 01/28/2019  . Influenza Split 02/15/1996, 02/22/2007, 02/11/2011, 02/10/2012  . Influenza Whole 01/12/2008, 02/14/2009, 02/25/2010  . Influenza, High Dose Seasonal PF 02/03/2013, 02/06/2014, 01/22/2015, 02/05/2016, 02/02/2017, 01/26/2018  . Pneumococcal Conjugate-13 12/13/2015  . Pneumococcal Polysaccharide-23 08/25/2005, 12/06/2012  . Td 07/26/1996  . Tdap 11/04/2006, 01/22/2017  . Zoster 08/25/2005  . Zoster Recombinat (Shingrix) 01/22/2017, 06/11/2017   Health Maintenance Due  Topic Date Due  . COVID-19 Vaccine (1) Never done  . INFLUENZA VACCINE  11/27/2019    Last Pap smear: aged out  Last mammogram: 08/24/17 Last colonoscopy:04/28/05 Last DEXA: 05/31/10. Does not want to repeat this. Dentist:Q year Ophtho:Q year Exercise: biking daily  Other doctors caring for patient include: Dr. Radford Pax cardio, Dr. Katy Fitch eye  Advanced directives: Does Patient Have a Medical Advance Directive?: Yes Type of Advance Directive: Healthcare Power of Attorney, Living will, Out of facility  DNR (pink MOST or yellow form) Does patient want to make changes to medical advance directive?: No - Patient declined Copy of [place] in Chart?: No - copy requested  Depression screen:  See questionnaire below.  Depression screen [facility] 2/9 01/30/2020 01/28/2019 01/26/2018 01/20/2017 12/13/2015  Decreased Interest 0 0 0 0 0  Down, Depressed, Hopeless 0 0 0 0 0  PHQ - 2 Score 0 0 0 0 0    Fall Risk Screen: see questionnaire below. Fall Risk  01/30/2020 01/28/2019 01/28/2019 01/26/2018 01/20/2017  Falls in the past year? 0 0 0 No No    ADL screen:  See questionnaire below Functional Status Survey: Is the patient deaf or have difficulty hearing?: No Does the patient have difficulty seeing, even when wearing glasses/contacts?: No Does the patient have difficulty concentrating, remembering, or making decisions?: No Does the patient have difficulty walking or climbing stairs?: No Does the patient have difficulty dressing or bathing?: No Does the patient have difficulty doing errands alone such as visiting a doctor's office or shopping?: No   Review of Systems Constitutional: -, -unexpected weight change, -anorexia, -fatigue Allergy: -sneezing, -itching, -congestion Dermatology: denies changing moles, rash, lumps ENT: -runny nose, -ear pain, -sore throat,  Cardiology:  -chest pain, -palpitations, -orthopnea, Respiratory: -cough, -shortness of breath, -dyspnea on exertion, -wheezing,  Gastroenterology: -abdominal pain, -nausea, -vomiting, -diarrhea, -constipation, -dysphagia Hematology: -bleeding or bruising problems Musculoskeletal: -arthralgias, -myalgias, -joint swelling, -back pain, - Ophthalmology: -vision changes,  Urology: -dysuria, -difficulty urinating,  -urinary frequency, -urgency, incontinence Neurology: -, -numbness, , -memory loss, -falls, -dizziness    PHYSICAL EXAM:  General Appearance: Alert, cooperative, no distress, appears stated age Head:  Normocephalic, without obvious abnormality, atraumatic Eyes: PERRL, conjunctiva/corneas clear, EOM's  intact. Ears: Normal TM's and external ear canals Nose: Nares normal, mucosa normal, no drainage or sinus tenderness Throat: Lips, mucosa, and tongue normal; teeth and gums normal Neck: Supple, no lymphadenopathy;  thyroid:  no enlargement/tenderness/nodules; no carotid bruit or JVD Lungs: Clear to auscultation bilaterally without wheezes, rales or ronchi; respirations unlabored Heart: Regular rate and rhythm, S1 and S2 normal, no murmur, rubor gallop Abdomen: Soft, non-tender, nondistended, normoactive bowel sounds,  no masses, no hepatosplenomegaly Extremities: No clubbing, cyanosis or edema Pulses: 2+ and symmetric all extremities Skin: Purpuric lesions noted on both forearms. Lymph nodes: Cervical, supraclavicular, and axillary nodes normal Neurologic:  CNII-XII intact, normal strength, sensation and gait; reflexes 2+ and symmetric throughout Psych: Normal mood, affect, hygiene and grooming.  ASSESSMENT/PLAN: Routine general medical examination at a health care facility  Need for influenza vaccination - Plan: Flu vaccine HIGH DOSE PF (Fluzone High dose)  Obesity (BMI 30-39.9)  Essential hypertension - Plan: amLODipine (NORVASC) 5 MG tablet, bisoprolol-hydrochlorothiazide (ZIAC) 10-6.25 MG tablet  Hyperlipidemia LDL goal <100 - Plan: simvastatin (ZOCOR) 20 MG tablet  Arthritis  Family history of heart disease in female family member before age 29  Osteopenia, unspecified location  History of kidney stones  History of cataract extraction, unspecified laterality  Glucose intolerance  Senile purpura ([place])  Atrial fibrillation, unspecified type ([place]) - Plan: rivaroxaban (XARELTO) 20 MG TABS tablet  Malignant neoplasm of upper-outer quadrant of right female breast, unspecified estrogen receptor status ([place])  Immunization, viral disease - Plan: Pfizer SARS-COV-2  Vaccine   Discussed ; at least 30 minutes of aerobic activity at least 5 days/week and weight-bearing exercise 2x/week; Immunization recommendations discussed.  Colonoscopy recommendations reviewed   Medicare Attestation I have personally reviewed: The patient's medical and social history Their use of alcohol, tobacco or illicit drugs Their current medications and supplements The patient's functional ability including ADLs,fall risks, home safety risks, cognitive, and hearing and visual impairment Diet and physical activities Evidence for depression or mood disorders  The patient's weight, height, and BMI have been recorded in the chart.  I have made referrals, counseling, and provided education to the patient based on review of the above and I have provided the patient with a written personalized care plan for preventive services.

## 2020-01-31 LAB — CBC WITH DIFFERENTIAL/PLATELET
Basophils Absolute: 0 10*3/uL (ref 0.0–0.2)
Basos: 0 %
EOS (ABSOLUTE): 0.6 10*3/uL — ABNORMAL HIGH (ref 0.0–0.4)
Eos: 7 %
Hematocrit: 42.7 % (ref 34.0–46.6)
Hemoglobin: 14 g/dL (ref 11.1–15.9)
Immature Grans (Abs): 0 10*3/uL (ref 0.0–0.1)
Immature Granulocytes: 0 %
Lymphocytes Absolute: 2.4 10*3/uL (ref 0.7–3.1)
Lymphs: 27 %
MCH: 27.3 pg (ref 26.6–33.0)
MCHC: 32.8 g/dL (ref 31.5–35.7)
MCV: 83 fL (ref 79–97)
Monocytes Absolute: 0.5 10*3/uL (ref 0.1–0.9)
Monocytes: 6 %
Neutrophils Absolute: 5.4 10*3/uL (ref 1.4–7.0)
Neutrophils: 60 %
Platelets: 210 10*3/uL (ref 150–450)
RBC: 5.12 x10E6/uL (ref 3.77–5.28)
RDW: 13.2 % (ref 11.7–15.4)
WBC: 9 10*3/uL (ref 3.4–10.8)

## 2020-01-31 LAB — COMPREHENSIVE METABOLIC PANEL
ALT: 15 IU/L (ref 0–32)
AST: 18 IU/L (ref 0–40)
Albumin/Globulin Ratio: 1.9 (ref 1.2–2.2)
Albumin: 4.7 g/dL — ABNORMAL HIGH (ref 3.6–4.6)
Alkaline Phosphatase: 119 IU/L (ref 44–121)
BUN/Creatinine Ratio: 16 (ref 12–28)
BUN: 14 mg/dL (ref 8–27)
Bilirubin Total: 0.7 mg/dL (ref 0.0–1.2)
CO2: 25 mmol/L (ref 20–29)
Calcium: 9.5 mg/dL (ref 8.7–10.3)
Chloride: 104 mmol/L (ref 96–106)
Creatinine, Ser: 0.9 mg/dL (ref 0.57–1.00)
GFR calc Af Amer: 69 mL/min/{1.73_m2} (ref >59)
GFR calc non Af Amer: 60 mL/min/{1.73_m2} (ref >59)
Globulin, Total: 2.5 g/dL (ref 1.5–4.5)
Glucose: 120 mg/dL — ABNORMAL HIGH (ref 65–99)
Potassium: 4.2 mmol/L (ref 3.5–5.2)
Sodium: 145 mmol/L — ABNORMAL HIGH (ref 134–144)
Total Protein: 7.2 g/dL (ref 6.0–8.5)

## 2020-01-31 LAB — LIPID PANEL
Chol/HDL Ratio: 2.9 ratio (ref 0.0–4.4)
Cholesterol, Total: 155 mg/dL (ref 100–199)
HDL: 54 mg/dL (ref >39)
LDL Chol Calc (NIH): 84 mg/dL (ref 0–99)
Triglycerides: 94 mg/dL (ref 0–149)
VLDL Cholesterol Cal: 17 mg/dL (ref 5–40)

## 2020-02-02 LAB — HGB A1C W/O EAG: Hgb A1c MFr Bld: 6.2 % — ABNORMAL HIGH (ref 4.8–5.6)

## 2020-02-02 LAB — SPECIMEN STATUS REPORT

## 2020-07-24 ENCOUNTER — Telehealth: Payer: Self-pay | Admitting: Family Medicine

## 2020-07-24 DIAGNOSIS — I4891 Unspecified atrial fibrillation: Secondary | ICD-10-CM

## 2020-07-24 MED ORDER — RIVAROXABAN 20 MG PO TABS: ORAL_TABLET | ORAL | 0 refills | Status: DC

## 2020-07-24 NOTE — Telephone Encounter (Signed)
Pt needs refill on Xarelto sent to the [facility] on [address]

## 2020-10-22 ENCOUNTER — Telehealth: Payer: Medicare HMO | Admitting: Family Medicine

## 2020-10-22 ENCOUNTER — Other Ambulatory Visit: Payer: Self-pay

## 2020-10-22 DIAGNOSIS — I4891 Unspecified atrial fibrillation: Secondary | ICD-10-CM

## 2020-10-22 MED ORDER — RIVAROXABAN 20 MG PO TABS: ORAL_TABLET | ORAL | 0 refills | Status: DC

## 2020-10-22 NOTE — Telephone Encounter (Signed)
Done [facility] 

## 2020-10-22 NOTE — Telephone Encounter (Signed)
Pt called and is requesting a refill for her xarelto please send to the  Preferred Pharmacies     [place], [place]

## 2021-01-24 ENCOUNTER — Other Ambulatory Visit: Payer: Self-pay | Admitting: Family Medicine

## 2021-01-24 ENCOUNTER — Other Ambulatory Visit: Payer: Self-pay

## 2021-01-24 DIAGNOSIS — I4891 Unspecified atrial fibrillation: Secondary | ICD-10-CM

## 2021-01-24 MED ORDER — RIVAROXABAN 20 MG PO TABS: ORAL_TABLET | ORAL | 0 refills | Status: DC

## 2021-01-31 ENCOUNTER — Other Ambulatory Visit: Payer: Self-pay

## 2021-01-31 ENCOUNTER — Ambulatory Visit (INDEPENDENT_AMBULATORY_CARE_PROVIDER_SITE_OTHER): Payer: Medicare HMO | Admitting: Family Medicine

## 2021-01-31 ENCOUNTER — Encounter: Payer: Self-pay | Admitting: Family Medicine

## 2021-01-31 VITALS — BP 142/86 | HR 74 | Temp 98.0°F | Ht 64.5 in | Wt 189.4 lb

## 2021-01-31 DIAGNOSIS — E7439 Other disorders of intestinal carbohydrate absorption: Secondary | ICD-10-CM | POA: Diagnosis not present

## 2021-01-31 DIAGNOSIS — D692 Other nonthrombocytopenic purpura: Secondary | ICD-10-CM | POA: Diagnosis not present

## 2021-01-31 DIAGNOSIS — Z0000 Encounter for general adult medical examination without abnormal findings: Secondary | ICD-10-CM

## 2021-01-31 DIAGNOSIS — Z87442 Personal history of urinary calculi: Secondary | ICD-10-CM | POA: Diagnosis not present

## 2021-01-31 DIAGNOSIS — C50411 Malignant neoplasm of upper-outer quadrant of right female breast: Secondary | ICD-10-CM

## 2021-01-31 DIAGNOSIS — I4891 Unspecified atrial fibrillation: Secondary | ICD-10-CM

## 2021-01-31 DIAGNOSIS — M8580 Other specified disorders of bone density and structure, unspecified site: Secondary | ICD-10-CM | POA: Diagnosis not present

## 2021-01-31 DIAGNOSIS — Z8249 Family history of ischemic heart disease and other diseases of the circulatory system: Secondary | ICD-10-CM

## 2021-01-31 DIAGNOSIS — E669 Obesity, unspecified: Secondary | ICD-10-CM

## 2021-01-31 DIAGNOSIS — E785 Hyperlipidemia, unspecified: Secondary | ICD-10-CM

## 2021-01-31 DIAGNOSIS — M1990 Unspecified osteoarthritis, unspecified site: Secondary | ICD-10-CM | POA: Diagnosis not present

## 2021-01-31 DIAGNOSIS — I10 Essential (primary) hypertension: Secondary | ICD-10-CM

## 2021-01-31 DIAGNOSIS — Z9849 Cataract extraction status, unspecified eye: Secondary | ICD-10-CM | POA: Diagnosis not present

## 2021-01-31 DIAGNOSIS — Z23 Encounter for immunization: Secondary | ICD-10-CM | POA: Diagnosis not present

## 2021-01-31 DIAGNOSIS — Z85828 Personal history of other malignant neoplasm of skin: Secondary | ICD-10-CM

## 2021-01-31 MED ORDER — BISOPROLOL-HYDROCHLOROTHIAZIDE 10-6.25 MG PO TABS: 1.0000 | ORAL_TABLET | Freq: Every day | ORAL | 3 refills | Status: DC

## 2021-01-31 MED ORDER — SIMVASTATIN 20 MG PO TABS
ORAL_TABLET | ORAL | 3 refills | Status: DC
Start: 2021-01-31 — End: 2022-02-10

## 2021-01-31 MED ORDER — AMLODIPINE BESYLATE 5 MG PO TABS: ORAL_TABLET | ORAL | 3 refills | Status: DC

## 2021-01-31 MED ORDER — RIVAROXABAN 20 MG PO TABS: ORAL_TABLET | ORAL | 0 refills | Status: DC

## 2021-01-31 NOTE — Patient Instructions (Signed)
  Ms. Alexandra Bowen , Thank you for taking time to come for your Medicare Wellness Visit. I appreciate your ongoing commitment to your health goals. Please review the following plan we discussed and let me know if I can assist you in the future.   These are the goals we discussed:  Goals   None     This is a list of the screening recommended for you and due dates:  Health Maintenance  Topic Date Due   COVID-19 Vaccine (4 - Booster for Pfizer series) 04/23/2020   Tetanus Vaccine  01/23/2027   Flu Shot  Completed   DEXA scan (bone density measurement)  Completed   Zoster (Shingles) Vaccine  Completed   HPV Vaccine  Aged Out

## 2021-01-31 NOTE — Progress Notes (Signed)
Alexandra Bowen is a 85 y.o. female who presents for annual wellness visit ,CPE and follow-up on chronic medical conditions.  She has a history of atrial fibrillation.  She is on Xarelto for this.  She has not seen her cardiologist in well over a year.  She also continues on atropine and Ziac.  I having no difficulty with them.  She is taking simvastatin without any difficulty.  She does have a history of osteopenia but is not interested in following up with a DEXA scan.  She has had a right TKR and seems to be doing fairly well with that.  She does have a history of kidney stones but none recently.  She does see ophthalmology regularly and has had cataract surgery.  She does see dermatology regularly.  Otherwise she has no particular concerns or complaints.  Immunizations and Health Maintenance Immunization History  Administered Date(s) Administered   Fluad Quad(high Dose 65+) 01/28/2019, 01/30/2020   Influenza Split 02/15/1996, 02/22/2007, 02/11/2011, 02/10/2012   Influenza Whole 01/12/2008, 02/14/2009, 02/25/2010   Influenza, High Dose Seasonal PF 02/03/2013, 02/06/2014, 01/22/2015, 02/05/2016, 02/02/2017, 01/26/2018   PFIZER(Purple Top)SARS-COV-2 Vaccination 06/22/2019, 07/20/2019, 01/30/2020   Pneumococcal Conjugate-13 12/13/2015   Pneumococcal Polysaccharide-23 08/25/2005, 12/06/2012   Td 07/26/1996   Tdap 11/04/2006, 01/22/2017   Zoster Recombinat (Shingrix) 01/22/2017, 06/11/2017   Zoster, Live 08/25/2005   Health Maintenance Due  Topic Date Due   COVID-19 Vaccine (4 - Booster for Pfizer series) 04/23/2020   INFLUENZA VACCINE  11/26/2020    Last Pap smear: aged out  Last mammogram: aged out 08/24/17 Last colonoscopy:aged out  Last DEXA: aged out 05/31/2010 Dentist: Q year Ophtho: over two years Exercise: walking three days a week   Other doctors caring for patient include: Dr. Radford Pax cardiology; Dr. Sarajane Jews dermatology; Dr. Alvan Dame ortho.  Advanced directives: Does Patient Have a  Medical Advance Directive?: Yes Type of Advance Directive: Living will, Healthcare Power of Attorney Does patient want to make changes to medical advance directive?: Yes (ED - Information included in AVS) Copy of [place] in Chart?: No - copy requested  Depression screen:  See questionnaire below.  Depression screen [facility] 2/9 01/31/2021 01/30/2020 01/28/2019 01/26/2018 01/20/2017  Decreased Interest 0 0 0 0 0  Down, Depressed, Hopeless 0 0 0 0 0  PHQ - 2 Score 0 0 0 0 0    Fall Risk Screen: see questionnaire below. Fall Risk  01/31/2021 01/30/2020 01/28/2019 01/28/2019 01/26/2018  Falls in the past year? 0 0 0 0 No  Number falls in past yr: 0 - - - -  Injury with Fall? 0 - - - -  Risk for fall due to : No Fall Risks - - - -  Follow up Falls evaluation completed - - - -    ADL screen:  See questionnaire below Functional Status Survey: Is the patient deaf or have difficulty hearing?: No Does the patient have difficulty seeing, even when wearing glasses/contacts?: No Does the patient have difficulty concentrating, remembering, or making decisions?: No Does the patient have difficulty walking or climbing stairs?: No Does the patient have difficulty dressing or bathing?: No Does the patient have difficulty doing errands alone such as visiting a doctor's office or shopping?: No   Review of Systems Constitutional: -, -unexpected weight change, -anorexia, -fatigue Allergy: -sneezing, -itching, -congestion Dermatology: denies changing moles, rash, lumps ENT: -runny nose, -ear pain, -sore throat,  Cardiology:  -chest pain, -palpitations, -orthopnea, Respiratory: -cough, -shortness of breath, -dyspnea on exertion, -  wheezing,  Gastroenterology: -abdominal pain, -nausea, -vomiting, -diarrhea, -constipation, -dysphagia Hematology: -bleeding or bruising problems Musculoskeletal: -arthralgias, -myalgias, -joint swelling, -back pain, - Ophthalmology: -vision changes,  Urology:  -dysuria, -difficulty urinating,  -urinary frequency, -urgency, incontinence Neurology: -, -numbness, , -memory loss, -falls, -dizziness    PHYSICAL EXAM:    General Appearance: Alert, cooperative, no distress, appears stated age Head: Normocephalic, without obvious abnormality, atraumatic Eyes: PERRL, conjunctiva/corneas clear, EOM's intact,  Ears: Normal TM's and external ear canals Nose: Nares normal, mucosa normal, no drainage or sinus tenderness Throat: Lips, mucosa, and tongue normal; teeth and gums normal Neck: Supple, no lymphadenopathy;  thyroid:  no enlargement/tenderness/nodules; no carotid bruit or JVD Lungs: Clear to auscultation bilaterally without wheezes, rales or ronchi; respirations unlabored Heart: Regular rate and rhythm, S1 and S2 normal, no murmur, rubor gallop Abdomen: Soft, non-tender, nondistended, normoactive bowel sounds,  no masses, no hepatosplenomegaly Extremities: No clubbing, cyanosis or edema Pulses: 2+ and symmetric all extremities Skin:  Skin color, texture, turgor normal, except for purpuric lesions on the forearms. Lymph nodes: Cervical, supraclavicular, and axillary nodes normal Neurologic:  CNII-XII intact, normal strength, sensation and gait; reflexes 2+ and symmetric throughout Psych: Normal mood, affect, hygiene and grooming.  ASSESSMENT/PLAN: Routine general medical examination at a health care facility - Plan: CBC with Differential/Platelet, Comprehensive metabolic panel, Lipid panel  Atrial fibrillation, unspecified type ([place]) - Plan: rivaroxaban (XARELTO) 20 MG TABS tablet, CBC with Differential/Platelet, Comprehensive metabolic panel, Lipid panel  Obesity (BMI 30-39.9)  Arthritis - s/p TKR Right  Hyperlipidemia LDL goal <100 - Plan: simvastatin (ZOCOR) 20 MG tablet, Lipid panel  Essential hypertension - Plan: amLODipine (NORVASC) 5 MG tablet, bisoprolol-hydrochlorothiazide (ZIAC) 10-6.25 MG tablet, CBC with Differential/Platelet,  Comprehensive metabolic panel  Family history of heart disease in female family member before age 29  Osteopenia, unspecified location  History of kidney stones  History of cataract extraction, unspecified laterality  Glucose intolerance - Plan: Comprehensive metabolic panel  Senile purpura ([place])  Malignant neoplasm of upper-outer quadrant of right female breast, unspecified estrogen receptor status ([place])  History of basal cell carcinoma  Immunization, viral disease - Plan: Ambulance person Booster  Need for influenza vaccination - Plan: Flu Vaccine QUAD High Dose(Fluad) She will schedule an appointment to see her cardiologist as it had been quite sometime.  Continue on her present medications.  I Discussed at least 30 minutes of aerobic activity at least 5 days/week and weight-bearing exercise 2x/week;  Immunization recommendations discussed.  Colonoscopy recommendations reviewed   Medicare Attestation I have personally reviewed: The patient's medical and social history Their use of alcohol, tobacco or illicit drugs Their current medications and supplements The patient's functional ability including ADLs,fall risks, home safety risks, cognitive, and hearing and visual impairment Diet and physical activities Evidence for depression or mood disorders  The patient's weight, height, and BMI have been recorded in the chart.  I have made referrals, counseling, and provided education to the patient based on review of the above and I have provided the patient with a written personalized care plan for preventive services.     [name], MD   01/31/2021

## 2021-02-01 LAB — COMPREHENSIVE METABOLIC PANEL
ALT: 16 IU/L (ref 0–32)
AST: 24 IU/L (ref 0–40)
Albumin/Globulin Ratio: 2.4 — ABNORMAL HIGH (ref 1.2–2.2)
Albumin: 4.8 g/dL — ABNORMAL HIGH (ref 3.6–4.6)
Alkaline Phosphatase: 125 IU/L — ABNORMAL HIGH (ref 44–121)
BUN/Creatinine Ratio: 13 (ref 12–28)
BUN: 11 mg/dL (ref 8–27)
Bilirubin Total: 1 mg/dL (ref 0.0–1.2)
CO2: 25 mmol/L (ref 20–29)
Calcium: 10 mg/dL (ref 8.7–10.3)
Chloride: 103 mmol/L (ref 96–106)
Creatinine, Ser: 0.86 mg/dL (ref 0.57–1.00)
Globulin, Total: 2 g/dL (ref 1.5–4.5)
Glucose: 118 mg/dL — ABNORMAL HIGH (ref 70–99)
Potassium: 4.3 mmol/L (ref 3.5–5.2)
Sodium: 145 mmol/L — ABNORMAL HIGH (ref 134–144)
Total Protein: 6.8 g/dL (ref 6.0–8.5)
eGFR: 67 mL/min/{1.73_m2} (ref >59)

## 2021-02-01 LAB — CBC WITH DIFFERENTIAL/PLATELET
Basophils Absolute: 0 10*3/uL (ref 0.0–0.2)
Basos: 1 %
EOS (ABSOLUTE): 0.4 10*3/uL (ref 0.0–0.4)
Eos: 5 %
Hematocrit: 42.7 % (ref 34.0–46.6)
Hemoglobin: 13.8 g/dL (ref 11.1–15.9)
Immature Grans (Abs): 0 10*3/uL (ref 0.0–0.1)
Immature Granulocytes: 0 %
Lymphocytes Absolute: 1.6 10*3/uL (ref 0.7–3.1)
Lymphs: 21 %
MCH: 26.8 pg (ref 26.6–33.0)
MCHC: 32.3 g/dL (ref 31.5–35.7)
MCV: 83 fL (ref 79–97)
Monocytes Absolute: 0.5 10*3/uL (ref 0.1–0.9)
Monocytes: 7 %
Neutrophils Absolute: 5 10*3/uL (ref 1.4–7.0)
Neutrophils: 66 %
Platelets: 201 10*3/uL (ref 150–450)
RBC: 5.15 x10E6/uL (ref 3.77–5.28)
RDW: 13.1 % (ref 11.7–15.4)
WBC: 7.5 10*3/uL (ref 3.4–10.8)

## 2021-02-01 LAB — LIPID PANEL
Chol/HDL Ratio: 2.6 ratio (ref 0.0–4.4)
Cholesterol, Total: 138 mg/dL (ref 100–199)
HDL: 54 mg/dL (ref >39)
LDL Chol Calc (NIH): 68 mg/dL (ref 0–99)
Triglycerides: 86 mg/dL (ref 0–149)
VLDL Cholesterol Cal: 16 mg/dL (ref 5–40)

## 2021-04-24 ENCOUNTER — Other Ambulatory Visit: Payer: Self-pay

## 2021-04-24 ENCOUNTER — Telehealth: Payer: Self-pay | Admitting: Family Medicine

## 2021-04-24 DIAGNOSIS — I4891 Unspecified atrial fibrillation: Secondary | ICD-10-CM

## 2021-04-24 MED ORDER — RIVAROXABAN 20 MG PO TABS: ORAL_TABLET | ORAL | 0 refills | Status: DC

## 2021-04-24 NOTE — Telephone Encounter (Signed)
Rx already filled earlier today

## 2021-04-24 NOTE — Telephone Encounter (Signed)
Pt called and is requesting a refill for her xarelto please send to the [facility], [address] AT [place]  She said go ahead and send it so they will have it on file

## 2021-10-25 ENCOUNTER — Other Ambulatory Visit: Payer: Self-pay

## 2021-10-25 DIAGNOSIS — I4891 Unspecified atrial fibrillation: Secondary | ICD-10-CM

## 2021-10-25 MED ORDER — RIVAROXABAN 20 MG PO TABS: ORAL_TABLET | ORAL | 0 refills | Status: DC

## 2021-12-25 DIAGNOSIS — H3530 Unspecified macular degeneration: Secondary | ICD-10-CM | POA: Diagnosis not present

## 2022-01-13 ENCOUNTER — Other Ambulatory Visit (INDEPENDENT_AMBULATORY_CARE_PROVIDER_SITE_OTHER): Payer: Medicare HMO

## 2022-01-13 DIAGNOSIS — Z23 Encounter for immunization: Secondary | ICD-10-CM

## 2022-01-21 DIAGNOSIS — H353132 Nonexudative age-related macular degeneration, bilateral, intermediate dry stage: Secondary | ICD-10-CM | POA: Diagnosis not present

## 2022-01-21 DIAGNOSIS — H43813 Vitreous degeneration, bilateral: Secondary | ICD-10-CM | POA: Diagnosis not present

## 2022-01-23 ENCOUNTER — Other Ambulatory Visit: Payer: Self-pay | Admitting: Family Medicine

## 2022-01-23 DIAGNOSIS — E785 Hyperlipidemia, unspecified: Secondary | ICD-10-CM

## 2022-01-23 DIAGNOSIS — I10 Essential (primary) hypertension: Secondary | ICD-10-CM

## 2022-01-23 NOTE — Telephone Encounter (Signed)
Lvm to find out if pt needed before her appt .[place]

## 2022-01-31 ENCOUNTER — Other Ambulatory Visit: Payer: Self-pay | Admitting: Family Medicine

## 2022-01-31 DIAGNOSIS — I4891 Unspecified atrial fibrillation: Secondary | ICD-10-CM

## 2022-02-10 ENCOUNTER — Encounter: Payer: Self-pay | Admitting: Family Medicine

## 2022-02-10 ENCOUNTER — Ambulatory Visit (INDEPENDENT_AMBULATORY_CARE_PROVIDER_SITE_OTHER): Payer: Medicare HMO | Admitting: Family Medicine

## 2022-02-10 ENCOUNTER — Other Ambulatory Visit: Payer: Self-pay

## 2022-02-10 ENCOUNTER — Telehealth: Payer: Self-pay | Admitting: Family Medicine

## 2022-02-10 VITALS — BP 140/84 | HR 84 | Temp 98.1°F | Ht 65.0 in | Wt 181.6 lb

## 2022-02-10 DIAGNOSIS — Z23 Encounter for immunization: Secondary | ICD-10-CM

## 2022-02-10 DIAGNOSIS — Z8249 Family history of ischemic heart disease and other diseases of the circulatory system: Secondary | ICD-10-CM | POA: Diagnosis not present

## 2022-02-10 DIAGNOSIS — M8580 Other specified disorders of bone density and structure, unspecified site: Secondary | ICD-10-CM

## 2022-02-10 DIAGNOSIS — Z0000 Encounter for general adult medical examination without abnormal findings: Secondary | ICD-10-CM | POA: Diagnosis not present

## 2022-02-10 DIAGNOSIS — D692 Other nonthrombocytopenic purpura: Secondary | ICD-10-CM | POA: Diagnosis not present

## 2022-02-10 DIAGNOSIS — I10 Essential (primary) hypertension: Secondary | ICD-10-CM | POA: Diagnosis not present

## 2022-02-10 DIAGNOSIS — C50212 Malignant neoplasm of upper-inner quadrant of left female breast: Secondary | ICD-10-CM

## 2022-02-10 DIAGNOSIS — Z9849 Cataract extraction status, unspecified eye: Secondary | ICD-10-CM

## 2022-02-10 DIAGNOSIS — E785 Hyperlipidemia, unspecified: Secondary | ICD-10-CM

## 2022-02-10 DIAGNOSIS — M1990 Unspecified osteoarthritis, unspecified site: Secondary | ICD-10-CM

## 2022-02-10 DIAGNOSIS — E7439 Other disorders of intestinal carbohydrate absorption: Secondary | ICD-10-CM

## 2022-02-10 DIAGNOSIS — I4891 Unspecified atrial fibrillation: Secondary | ICD-10-CM | POA: Diagnosis not present

## 2022-02-10 DIAGNOSIS — E669 Obesity, unspecified: Secondary | ICD-10-CM | POA: Diagnosis not present

## 2022-02-10 LAB — POCT GLYCOSYLATED HEMOGLOBIN (HGB A1C): Hemoglobin A1C: 5.9 % — AB (ref 4.0–5.6)

## 2022-02-10 MED ORDER — AMLODIPINE BESYLATE 5 MG PO TABS: ORAL_TABLET | ORAL | 3 refills | Status: DC

## 2022-02-10 MED ORDER — RIVAROXABAN 20 MG PO TABS: ORAL_TABLET | ORAL | 3 refills | Status: DC

## 2022-02-10 MED ORDER — SIMVASTATIN 20 MG PO TABS: ORAL_TABLET | ORAL | 3 refills | Status: DC

## 2022-02-10 MED ORDER — BISOPROLOL-HYDROCHLOROTHIAZIDE 10-6.25 MG PO TABS: 1.0000 | ORAL_TABLET | Freq: Every day | ORAL | 3 refills | Status: DC

## 2022-02-10 NOTE — Progress Notes (Signed)
Subjective:    Patient ID: Alexandra Bowen, female    DOB: [birth year], 86 y.o.   MRN: [id number]  HPI Alexandra Bowen is a 86 y.o. female who presents for annual wellness visit,CPE and follow-up on chronic medical conditions.  She has no particular concerns or complaints.  She continues on Xarelto without difficulty except for the cost.  She is also taking amlodipine and Ziac.  Simvastatin is causing no difficulty.  She does complain of arthritic symptoms but is taking care of it with OTC medications.  She has remote history of breast cancer diagnosed in 2014.  She has had cataracts removed and followed by her ophthalmologist.  Also sees cardiology regularly for her A-fib.  Immunizations and Health Maintenance Immunization History  Administered Date(s) Administered   Fluad Quad(high Dose 65+) 01/28/2019, 01/30/2020, 01/31/2021, 01/13/2022   Influenza Split 02/15/1996, 02/22/2007, 02/11/2011, 02/10/2012   Influenza Whole 01/12/2008, 02/14/2009, 02/25/2010   Influenza, High Dose Seasonal PF 02/03/2013, 02/06/2014, 01/22/2015, 02/05/2016, 02/02/2017, 01/26/2018   PFIZER(Purple Top)SARS-COV-2 Vaccination 06/22/2019, 07/20/2019, 01/30/2020   Pfizer Covid-19 Vaccine Bivalent Booster 47yr & up 01/31/2021   Pneumococcal Conjugate-13 12/13/2015   Pneumococcal Polysaccharide-23 08/25/2005, 12/06/2012   Td 07/26/1996   Tdap 11/04/2006, 01/22/2017   Zoster Recombinat (Shingrix) 01/22/2017, 06/11/2017   Zoster, Live 08/25/2005   Health Maintenance Due  Topic Date Due   COVID-19 Vaccine (5 - Pfizer risk series) 03/28/2021    Last Pap smear: long time ago Last mammogram: not anymore Last colonoscopy: not anymore Last DEXA: not anymore REFUSES Dentist: every year  Ophtho: every year  Exercise: walking every day   Other doctors caring for patient include: Cardiology Dr. TGolden Hurter  Advanced directives: Does Patient Have a Medical Advance Directive?: Yes Type of Advance Directive:  Healthcare Power of Attorney, Living will Does patient want to make changes to medical advance directive?: Yes (ED - Information included in AVS) Copy of [place] Chart?: No - copy requested  Depression screen:  See questionnaire below.     02/10/2022   10:06 AM 01/31/2021    9:44 AM 01/30/2020    1:27 PM 01/28/2019   10:41 AM 01/26/2018    2:13 PM  Depression screen PHQ 2/9  Decreased Interest 0 0 0 0 0  Down, Depressed, Hopeless 0 0 0 0 0  PHQ - 2 Score 0 0 0 0 0    Fall Risk Screen: see questionnaire below.    02/10/2022   10:05 AM 01/31/2021    9:44 AM 01/30/2020    1:27 PM 01/28/2019   10:42 AM 01/28/2019   10:41 AM  Fall Risk   Falls in the past year? 0 0 0 0 0  Number falls in past yr: 0 0     Injury with Fall? 0 0     Risk for fall due to : No Fall Risks No Fall Risks     Follow up Falls evaluation completed Falls evaluation completed       ADL screen:  See questionnaire below Functional Status Survey: Is the patient deaf or have difficulty hearing?: No Does the patient have difficulty seeing, even when wearing glasses/contacts?: No Does the patient have difficulty concentrating, remembering, or making decisions?: No Does the patient have difficulty walking or climbing stairs?: No Does the patient have difficulty dressing or bathing?: No Does the patient have difficulty doing errands alone such as visiting a doctor's office or shopping?: No   Review of Systems Constitutional: -, -unexpected  weight change, -anorexia, -fatigue Allergy: -sneezing, -itching, -congestion Dermatology: denies changing moles, rash, lumps ENT: -runny nose, -ear pain, -sore throat,  Cardiology:  -chest pain, -palpitations, -orthopnea, Respiratory: -cough, -shortness of breath, -dyspnea on exertion, -wheezing,  Gastroenterology: -abdominal pain, -nausea, -vomiting, -diarrhea, -constipation, -dysphagia Hematology: -bleeding or bruising problems Musculoskeletal:  -arthralgias, -myalgias, -joint swelling, -back pain, - Ophthalmology: -vision changes,  Urology: -dysuria, -difficulty urinating,  -urinary frequency, -urgency, incontinence Neurology: -, -numbness, , -memory loss, -falls, -dizziness        Objective:   Physical Exam  BP (!) 140/84   Pulse 84   Temp 98.1 F (36.7 C)   Ht '5\' 5"'$  (1.651 m)   Wt 181 lb 9.6 oz (82.4 kg)   BMI 30.22 kg/m   General Appearance: Alert, cooperative, no distress, appears stated age Head: Normocephalic, without obvious abnormality, atraumatic Eyes: PERRL, conjunctiva/corneas clear, EOM's intact,  Ears: Normal TM's and external ear canals Nose: Nares normal, mucosa normal, no drainage or sinus tenderness Throat: Lips, mucosa, and tongue normal; teeth and gums normal Neck: Supple, no lymphadenopathy;  thyroid:  no enlargement/tenderness/nodules; no carotid bruit or JVD Lungs: Clear to auscultation bilaterally without wheezes, rales or ronchi; respirations unlabored Heart: Irregular rate, S1 and S2 normal, no murmur, rubor gallop Abdomen: Soft, non-tender, nondistended, normoactive bowel sounds,  no masses, no hepatosplenomegaly Extremities: No clubbing, cyanosis or edema Pulses: 2+ and symmetric all extremities   Skin : Purpuric lesions noted on both forearms. Lymph nodes: Cervical, supraclavicular, and axillary nodes normal Neurologic:  CNII-XII intact, normal strength, sensation and gait; reflexes 2+ and symmetric throughout Psych: Normal mood, affect, hygiene and grooming. Hemoglobin A1c is 5.9       Assessment & Plan:  Routine general medical examination at a health care facility - Plan: CBC with Differential/Platelet, Comprehensive metabolic panel, Lipid panel  Atrial fibrillation, unspecified type ([place]) - Plan: CBC with Differential/Platelet, Comprehensive metabolic panel, Lipid panel, rivaroxaban (XARELTO) 20 MG TABS tablet  Primary hypertension - Plan: CBC with Differential/Platelet,  Comprehensive metabolic panel  Senile purpura (HCC)  Arthritis  Osteopenia, unspecified location  Obesity (BMI 30-39.9) - Plan: CBC with Differential/Platelet, Comprehensive metabolic panel, Lipid panel  Hyperlipidemia LDL goal <100 - Plan: Lipid panel, simvastatin (ZOCOR) 20 MG tablet  Family history of heart disease in female family member before age 6  Malignant neoplasm of upper-inner quadrant of left female breast, unspecified estrogen receptor status ([place]) - 2014  History of cataract extraction, unspecified laterality  Glucose intolerance - Plan: HgB A1c  Essential hypertension - Plan: CBC with Differential/Platelet, Comprehensive metabolic panel, amLODipine (NORVASC) 5 MG tablet  Need for COVID-19 vaccine - Plan: PFIZER Comirnaty(GRAY TOP)COVID-19 Vaccine   Discussed healthy diet, including goals of calcium and vitamin D intake Immunization recommendations discussed.  Colonoscopy recommendations reviewed   Medicare Attestation I have personally reviewed: The patient's medical and social history Their use of alcohol, tobacco or illicit drugs Their current medications and supplements The patient's functional ability including ADLs,fall risks, home safety risks, cognitive, and hearing and visual impairment Diet and physical activities Evidence for depression or mood disorders  The patient's weight, height, and BMI have been recorded in the chart.  I have made referrals, counseling, and provided education to the patient based on review of the above and I have provided the patient with a written personalized care plan for preventive services.     [name], MD   02/10/2022

## 2022-02-10 NOTE — Telephone Encounter (Signed)
Pt walked back in and noticed her prescription for bisoprolol was not sent in and she is out. She prefers pharmacy [facility], [address] AT [place]. She also says she already picked up a year supply for her rivaroxaban and does not need that refill that was put in today.

## 2022-02-11 LAB — COMPREHENSIVE METABOLIC PANEL
ALT: 18 IU/L (ref 0–32)
AST: 22 IU/L (ref 0–40)
Albumin/Globulin Ratio: 1.7 (ref 1.2–2.2)
Albumin: 4.6 g/dL (ref 3.7–4.7)
Alkaline Phosphatase: 126 IU/L — ABNORMAL HIGH (ref 44–121)
BUN/Creatinine Ratio: 12 (ref 12–28)
BUN: 11 mg/dL (ref 8–27)
Bilirubin Total: 1 mg/dL (ref 0.0–1.2)
CO2: 27 mmol/L (ref 20–29)
Calcium: 9.7 mg/dL (ref 8.7–10.3)
Chloride: 101 mmol/L (ref 96–106)
Creatinine, Ser: 0.89 mg/dL (ref 0.57–1.00)
Globulin, Total: 2.7 g/dL (ref 1.5–4.5)
Glucose: 130 mg/dL — ABNORMAL HIGH (ref 70–99)
Potassium: 4.5 mmol/L (ref 3.5–5.2)
Sodium: 142 mmol/L (ref 134–144)
Total Protein: 7.3 g/dL (ref 6.0–8.5)
eGFR: 64 mL/min/{1.73_m2} (ref >59)

## 2022-02-11 LAB — LIPID PANEL
Chol/HDL Ratio: 2.8 ratio (ref 0.0–4.4)
Cholesterol, Total: 146 mg/dL (ref 100–199)
HDL: 53 mg/dL (ref >39)
LDL Chol Calc (NIH): 77 mg/dL (ref 0–99)
Triglycerides: 87 mg/dL (ref 0–149)
VLDL Cholesterol Cal: 16 mg/dL (ref 5–40)

## 2022-02-11 LAB — CBC WITH DIFFERENTIAL/PLATELET
Basophils Absolute: 0 10*3/uL (ref 0.0–0.2)
Basos: 0 %
EOS (ABSOLUTE): 0.2 10*3/uL (ref 0.0–0.4)
Eos: 3 %
Hematocrit: 44.7 % (ref 34.0–46.6)
Hemoglobin: 14.6 g/dL (ref 11.1–15.9)
Immature Grans (Abs): 0 10*3/uL (ref 0.0–0.1)
Immature Granulocytes: 1 %
Lymphocytes Absolute: 1.7 10*3/uL (ref 0.7–3.1)
Lymphs: 19 %
MCH: 27.2 pg (ref 26.6–33.0)
MCHC: 32.7 g/dL (ref 31.5–35.7)
MCV: 83 fL (ref 79–97)
Monocytes Absolute: 0.6 10*3/uL (ref 0.1–0.9)
Monocytes: 7 %
Neutrophils Absolute: 6.3 10*3/uL (ref 1.4–7.0)
Neutrophils: 70 %
Platelets: 202 10*3/uL (ref 150–450)
RBC: 5.37 x10E6/uL — ABNORMAL HIGH (ref 3.77–5.28)
RDW: 13.1 % (ref 11.7–15.4)
WBC: 8.9 10*3/uL (ref 3.4–10.8)

## 2022-05-09 ENCOUNTER — Other Ambulatory Visit: Payer: Self-pay | Admitting: Family Medicine

## 2022-05-09 DIAGNOSIS — I4891 Unspecified atrial fibrillation: Secondary | ICD-10-CM

## 2022-05-26 ENCOUNTER — Ambulatory Visit: Payer: Medicare HMO | Admitting: Cardiology

## 2022-10-03 ENCOUNTER — Ambulatory Visit: Payer: Medicare HMO | Attending: Cardiology | Admitting: Cardiology

## 2022-10-03 ENCOUNTER — Encounter: Payer: Self-pay | Admitting: Cardiology

## 2022-10-03 VITALS — BP 161/65 | HR 75 | Ht 65.0 in | Wt 185.4 lb

## 2022-10-03 DIAGNOSIS — Z79899 Other long term (current) drug therapy: Secondary | ICD-10-CM | POA: Diagnosis not present

## 2022-10-03 DIAGNOSIS — I482 Chronic atrial fibrillation, unspecified: Secondary | ICD-10-CM

## 2022-10-03 DIAGNOSIS — I10 Essential (primary) hypertension: Secondary | ICD-10-CM

## 2022-10-03 DIAGNOSIS — I272 Pulmonary hypertension, unspecified: Secondary | ICD-10-CM | POA: Diagnosis not present

## 2022-10-03 NOTE — Addendum Note (Signed)
Addended by: Luellen Pucker on: 10/03/2022 03:39 PM   Modules accepted: Orders

## 2022-10-03 NOTE — Progress Notes (Signed)
d Cardiology Office Consult  Note:    Date:  10/03/2022   ID:  Alexandra Bowen, DOB [birth year], MRN [id number]  PCP:  Ronnald Nian, MD  Cardiologist:  Armanda Magic, MD    Referring MD: Ronnald Nian, MD   Chief Complaint  Patient presents with   Atrial Fibrillation   Hypertension   Hyperlipidemia    History of Present Illness:    Alexandra Bowen is a 87 y.o. female with a hx of HTN, paroxysmal atrial fibrillation on chronic anticoagulation with DOAC, Fm Hx of CAD and HLD who is referred here today for evaluation of atrial fibrillation by her PCP Sharlot Gowda, MD  She was a newly seen by me back in 2020 for new onset atrial fibrillation.  Rate versus rhythm control was discussed but she did not want to pursue cardioversion and wanted to pursue rate control since she was asymptomatic.  2D echo was done which demonstrated normal LV function EF 65 to 70% with mild LVH, moderate pulmonary hypertension with PASP 47 mmHg, severe biatrial enlargement, mild MR and mild to moderate TR.  She has a remote history of stress test in 2018 showing no ischemia and EF 71%.  She was lost to follow-up after that and is now referred back by her PCP.  She is doing well today.  She is very active and mows her yard with no problems.  She denies any chest pain or SOB, LE edema, dizziness, palpitations, PND or orthopnea. No bleeding on DOAC.  Past Medical History:  Diagnosis Date   Allergy    Arthritis    Basal cell carcinoma (BCC) of nasal tip 02/04/2017   per path report   Breast cancer (HCC)    Ductal carcinoma in situ of left breast 08/2012   WITH COMEDO NECROSIS   Dyslipidemia    Glucose intolerance (impaired glucose tolerance)    Hypertension    WHITE COAT SYNDROME   Obesity    Renal stone     Past Surgical History:  Procedure Laterality Date   BREAST LUMPECTOMY WITH NEEDLE LOCALIZATION Left 10/06/2012   Procedure: LEFT BREAST LUMPECTOMY WITH NEEDLE LOCALIZATION;  Surgeon: Almond Lint,  MD;  Location: [facility];  Service: General;  Laterality: Left;  needle localization at 10:30 breast center of [place]    BREAST SURGERY Left 10/06/2012   NL Lumpectomy   BUNIONECTOMY     both feet   CARDIAC CATHETERIZATION  1998   routine-neg   CATARACT EXTRACTION, BILATERAL     COLONOSCOPY     DILATION AND CURETTAGE OF UTERUS  1998   HAMMER TOE SURGERY     both feet   TOTAL KNEE ARTHROPLASTY Right 05/27/2016   Procedure: RIGHT TOTAL KNEE ARTHROPLASTY;  Surgeon: Durene Romans, MD;  Location: [facility];  Service: Orthopedics;  Laterality: Right;    Current Medications: Current Meds  Medication Sig   acetaminophen (TYLENOL) 500 MG tablet Take 500 mg by mouth every 6 (six) hours as needed.   amLODipine (NORVASC) 5 MG tablet TAKE 1 TABLET(5 MG) BY MOUTH DAILY   bisoprolol-hydrochlorothiazide (ZIAC) 10-6.25 MG tablet Take 1 tablet by mouth daily.   Calcium Carb-Cholecalciferol (CALCIUM 600+D3 PO) Take 1 tablet by mouth daily.   Multiple Vitamins-Minerals (MULTIVITAMIN WITH MINERALS) tablet Take 1 tablet by mouth daily.     rivaroxaban (XARELTO) 20 MG TABS tablet TAKE 1 TABLET(20 MG) BY MOUTH DAILY WITH SUPPER   simvastatin (ZOCOR) 20 MG tablet TAKE 1 TABLET(20 MG)  BY MOUTH AT BEDTIME     Allergies:   Patient has no known allergies.   Social History   Socioeconomic History   Marital status: Widowed    Spouse name: Not on file   Number of children: Not on file   Years of education: Not on file   Highest education level: Not on file  Occupational History   Not on file  Tobacco Use   Smoking status: Never   Smokeless tobacco: Never  Substance and Sexual Activity   Alcohol use: No   Drug use: No   Sexual activity: Not Currently  Other Topics Concern   Not on file  Social History Narrative   Not on file   Social Determinants of Health   Financial Resource Strain: Not on file  Food Insecurity: Not on file  Transportation Needs: Not on file  Physical Activity: Not  on file  Stress: Not on file  Social Connections: Not on file     Family History: The patient's family history includes CVA in her brother; Diabetes in her mother; Heart disease in her brother, brother, brother, and sister; Lung cancer in her brother, sister, and sister; Throat cancer in her father.  ROS:   Please see the history of present illness.    ROS  All other systems reviewed and negative.   EKGs/Labs/Other Studies Reviewed:    The following studies were reviewed today: EKG, labs, office notes  EKG:  EKG is  ordered today.  The ekg ordered today demonstrates atrial fibrillation at 75bpm with nonspecific T wave abnormality  Recent Labs: 02/10/2022: ALT 18; BUN 11; Creatinine, Ser 0.89; Hemoglobin 14.6; Platelets 202; Potassium 4.5; Sodium 142   Recent Lipid Panel    Component Value Date/Time   CHOL 146 02/10/2022 1105   TRIG 87 02/10/2022 1105   HDL 53 02/10/2022 1105   CHOLHDL 2.8 02/10/2022 1105   CHOLHDL 2.6 01/20/2017 1442   VLDL 21 12/13/2015 0001   LDLCALC 77 02/10/2022 1105   LDLCALC 78 01/20/2017 1442    Physical Exam:    VS:  BP (!) 161/65   Pulse 75   Ht 5\' 5"  (1.651 m)   Wt 185 lb 6.4 oz (84.1 kg)   SpO2 96%   BMI 30.85 kg/m     Wt Readings from Last 3 Encounters:  10/03/22 185 lb 6.4 oz (84.1 kg)  02/10/22 181 lb 9.6 oz (82.4 kg)  01/31/21 189 lb 6.4 oz (85.9 kg)    GEN: Well nourished, well developed in no acute distress HEENT: Normal NECK: No JVD; No carotid bruits LYMPHATICS: No lymphadenopathy CARDIAC:irregularly irregular, no murmurs, rubs, gallops RESPIRATORY:  Clear to auscultation without rales, wheezing or rhonchi  ABDOMEN: Soft, non-tender, non-distended MUSCULOSKELETAL:  No edema; No deformity  SKIN: Warm and dry NEUROLOGIC:  Alert and oriented x 3 PSYCHIATRIC:  Normal affect  ASSESSMENT:    1. Chronic atrial fibrillation (HCC)   2. Benign essential HTN     PLAN:    In order of problems listed above:  1.   PAF -first dx in 01/2019 and was placed on DOAC.  At that point rate versus rhythm control was discussed and since she was asymptomatic she wanted to pursue rate control -She remains in atrial fibrillation with good heart rate control and is asymptomatic -She has not had any problems on Xarelto -Continue prescription drug management Xarelto 20 mg daily and bisoprolol HCT 10-6.25 mg daily with as needed refills -I have personally reviewed and interpreted outside  labs performed by patient's PCP which showed serum creatinine 0.89 potassium 4.5 on 02/10/2022 and hemoglobin 14.6 -check BMET and CBC today  2.  HTN -BP is adequately on exam -Continue prescription drug management with amlodipine 5 mg daily and bisoprolol HCT 10/12 6.25 mg daily with as needed refills  3.  HLD -followed by PCP -continue simvastatin 20mg  daily  4.  Pulmonary hypertension -Moderate by echo 2021 -recommended repeat 2D echo but she refuses due to severe pain with it prior  5.  Mitral regurgitation/Tricuspid regurgitation -mild MR and mild to moderate TR by echo 2021 -refuses repeat 2D echo  Medication Adjustments/Labs and Tests Ordered: Current medicines are reviewed at length with the patient today.  Concerns regarding medicines are outlined above.  Orders Placed This Encounter  Procedures   EKG 12-Lead   No orders of the defined types were placed in this encounter.   Signed, Armanda Magic, MD  10/03/2022 3:27 PM    [facility]

## 2022-10-03 NOTE — Patient Instructions (Signed)
Medication Instructions:  Your physician recommends that you continue on your current medications as directed. Please refer to the Current Medication list given to you today.  *If you need a refill on your cardiac medications before your next appointment, please call your pharmacy*   Lab Work: Please complete a CBC and BMET in our lab before you leave today.  If you have labs (blood work) drawn today and your tests are completely normal, you will receive your results only by: MyChart Message (if you have MyChart) OR A paper copy in the mail If you have any lab test that is abnormal or we need to change your treatment, we will call you to review the results.   Testing/Procedures: None.   Follow-Up: At [facility], you and your health needs are our priority.  As part of our continuing mission to provide you with exceptional heart care, we have created designated Provider Care Teams.  These Care Teams include your primary Cardiologist (physician) and Advanced Practice Providers (APPs -  Physician Assistants and Nurse Practitioners) who all work together to provide you with the care you need, when you need it.  We recommend signing up for the patient portal called "MyChart".  Sign up information is provided on this After Visit Summary.  MyChart is used to connect with patients for Virtual Visits (Telemedicine).  Patients are able to view lab/test results, encounter notes, upcoming appointments, etc.  Non-urgent messages can be sent to your provider as well.   To learn more about what you can do with MyChart, go to [email or web address].    Your next appointment:   1 year(s)  Provider:   Armanda Magic, MD

## 2022-10-04 LAB — BASIC METABOLIC PANEL
BUN/Creatinine Ratio: 15 (ref 12–28)
BUN: 13 mg/dL (ref 8–27)
CO2: 27 mmol/L (ref 20–29)
Calcium: 9.2 mg/dL (ref 8.7–10.3)
Chloride: 103 mmol/L (ref 96–106)
Creatinine, Ser: 0.86 mg/dL (ref 0.57–1.00)
Glucose: 100 mg/dL — ABNORMAL HIGH (ref 70–99)
Potassium: 4.1 mmol/L (ref 3.5–5.2)
Sodium: 145 mmol/L — ABNORMAL HIGH (ref 134–144)
eGFR: 67 mL/min/{1.73_m2} (ref >59)

## 2022-10-04 LAB — CBC WITH DIFFERENTIAL/PLATELET
Basophils Absolute: 0 10*3/uL (ref 0.0–0.2)
Basos: 0 %
EOS (ABSOLUTE): 0.2 10*3/uL (ref 0.0–0.4)
Eos: 3 %
Hematocrit: 41.1 % (ref 34.0–46.6)
Hemoglobin: 13.2 g/dL (ref 11.1–15.9)
Immature Grans (Abs): 0 10*3/uL (ref 0.0–0.1)
Immature Granulocytes: 1 %
Lymphocytes Absolute: 2.1 10*3/uL (ref 0.7–3.1)
Lymphs: 26 %
MCH: 27.1 pg (ref 26.6–33.0)
MCHC: 32.1 g/dL (ref 31.5–35.7)
MCV: 84 fL (ref 79–97)
Monocytes Absolute: 0.7 10*3/uL (ref 0.1–0.9)
Monocytes: 8 %
Neutrophils Absolute: 5.1 10*3/uL (ref 1.4–7.0)
Neutrophils: 62 %
Platelets: 194 10*3/uL (ref 150–450)
RBC: 4.87 x10E6/uL (ref 3.77–5.28)
RDW: 13.3 % (ref 11.7–15.4)
WBC: 8.2 10*3/uL (ref 3.4–10.8)

## 2022-11-11 ENCOUNTER — Ambulatory Visit: Payer: Medicare HMO

## 2022-11-11 VITALS — BP 160/80 | HR 85 | Temp 97.9°F | Ht 64.5 in | Wt 182.0 lb

## 2022-11-11 DIAGNOSIS — Z0000 Encounter for general adult medical examination without abnormal findings: Secondary | ICD-10-CM | POA: Diagnosis not present

## 2022-11-11 NOTE — Patient Instructions (Signed)
Alexandra Bowen , Thank you for taking time to come for your Medicare Wellness Visit. I appreciate your ongoing commitment to your health goals. Please review the following plan we discussed and let me know if I can assist you in the future.   These are the goals we discussed:  Goals      Patient Stated     11/11/2022, denies any goals at this time        This is a list of the screening recommended for you and due dates:  Health Maintenance  Topic Date Due   COVID-19 Vaccine (6 - 2023-24 season) 04/07/2022   Flu Shot  11/27/2022   Medicare Annual Wellness Visit  11/11/2023   DTaP/Tdap/Td vaccine (4 - Td or Tdap) 01/23/2027   Pneumonia Vaccine  Completed   DEXA scan (bone density measurement)  Completed   Zoster (Shingles) Vaccine  Completed   HPV Vaccine  Aged Out    Advanced directives: Please bring a copy of your POA (Power of [place]) and/or Living Will to your next appointment.   Conditions/risks identified: none  Next appointment: Follow up in one year for your annual wellness visit    Preventive Care 65 Years and Older, Female Preventive care refers to lifestyle choices and visits with your health care provider that can promote health and wellness. What does preventive care include? A yearly physical exam. This is also called an annual well check. Dental exams once or twice a year. Routine eye exams. Ask your health care provider how often you should have your eyes checked. Personal lifestyle choices, including: Daily care of your teeth and gums. Regular physical activity. Eating a healthy diet. Avoiding tobacco and drug use. Limiting alcohol use. Practicing safe sex. Taking low-dose aspirin every day. Taking vitamin and mineral supplements as recommended by your health care provider. What happens during an annual well check? The services and screenings done by your health care provider during your annual well check will depend on your age, overall health, lifestyle  risk factors, and family history of disease. Counseling  Your health care provider may ask you questions about your: Alcohol use. Tobacco use. Drug use. Emotional well-being. Home and relationship well-being. Sexual activity. Eating habits. History of falls. Memory and ability to understand (cognition). Work and work Astronomer. Reproductive health. Screening  You may have the following tests or measurements: Height, weight, and BMI. Blood pressure. Lipid and cholesterol levels. These may be checked every 5 years, or more frequently if you are over 25 years old. Skin check. Lung cancer screening. You may have this screening every year starting at age 34 if you have a 30-pack-year history of smoking and currently smoke or have quit within the past 15 years. Fecal occult blood test (FOBT) of the stool. You may have this test every year starting at age 25. Flexible sigmoidoscopy or colonoscopy. You may have a sigmoidoscopy every 5 years or a colonoscopy every 10 years starting at age 82. Hepatitis C blood test. Hepatitis B blood test. Sexually transmitted disease (STD) testing. Diabetes screening. This is done by checking your blood sugar (glucose) after you have not eaten for a while (fasting). You may have this done every 1-3 years. Bone density scan. This is done to screen for osteoporosis. You may have this done starting at age 16. Mammogram. This may be done every 1-2 years. Talk to your health care provider about how often you should have regular mammograms. Talk with your health care provider about your test  results, treatment options, and if necessary, the need for more tests. Vaccines  Your health care provider may recommend certain vaccines, such as: Influenza vaccine. This is recommended every year. Tetanus, diphtheria, and acellular pertussis (Tdap, Td) vaccine. You may need a Td booster every 10 years. Zoster vaccine. You may need this after age 62. Pneumococcal  13-valent conjugate (PCV13) vaccine. One dose is recommended after age 68. Pneumococcal polysaccharide (PPSV23) vaccine. One dose is recommended after age 44. Talk to your health care provider about which screenings and vaccines you need and how often you need them. This information is not intended to replace advice given to you by your health care provider. Make sure you discuss any questions you have with your health care provider. Document Released: 05/11/2015 Document Revised: 01/02/2016 Document Reviewed: 02/13/2015 Elsevier Interactive Patient Education  2017 [employer].  Fall Prevention in the Home Falls can cause injuries. They can happen to people of all ages. There are many things you can do to make your home safe and to help prevent falls. What can I do on the outside of my home? Regularly fix the edges of walkways and driveways and fix any cracks. Remove anything that might make you trip as you walk through a door, such as a raised step or threshold. Trim any bushes or trees on the path to your home. Use bright outdoor lighting. Clear any walking paths of anything that might make someone trip, such as rocks or tools. Regularly check to see if handrails are loose or broken. Make sure that both sides of any steps have handrails. Any raised decks and porches should have guardrails on the edges. Have any leaves, snow, or ice cleared regularly. Use sand or salt on walking paths during winter. Clean up any spills in your garage right away. This includes oil or grease spills. What can I do in the bathroom? Use night lights. Install grab bars by the toilet and in the tub and shower. Do not use towel bars as grab bars. Use non-skid mats or decals in the tub or shower. If you need to sit down in the shower, use a plastic, non-slip stool. Keep the floor dry. Clean up any water that spills on the floor as soon as it happens. Remove soap buildup in the tub or shower regularly. Attach  bath mats securely with double-sided non-slip rug tape. Do not have throw rugs and other things on the floor that can make you trip. What can I do in the bedroom? Use night lights. Make sure that you have a light by your bed that is easy to reach. Do not use any sheets or blankets that are too big for your bed. They should not hang down onto the floor. Have a firm chair that has side arms. You can use this for support while you get dressed. Do not have throw rugs and other things on the floor that can make you trip. What can I do in the kitchen? Clean up any spills right away. Avoid walking on wet floors. Keep items that you use a lot in easy-to-reach places. If you need to reach something above you, use a strong step stool that has a grab bar. Keep electrical cords out of the way. Do not use floor polish or wax that makes floors slippery. If you must use wax, use non-skid floor wax. Do not have throw rugs and other things on the floor that can make you trip. What can I do with my stairs?  Do not leave any items on the stairs. Make sure that there are handrails on both sides of the stairs and use them. Fix handrails that are broken or loose. Make sure that handrails are as long as the stairways. Check any carpeting to make sure that it is firmly attached to the stairs. Fix any carpet that is loose or worn. Avoid having throw rugs at the top or bottom of the stairs. If you do have throw rugs, attach them to the floor with carpet tape. Make sure that you have a light switch at the top of the stairs and the bottom of the stairs. If you do not have them, ask someone to add them for you. What else can I do to help prevent falls? Wear shoes that: Do not have high heels. Have rubber bottoms. Are comfortable and fit you well. Are closed at the toe. Do not wear sandals. If you use a stepladder: Make sure that it is fully opened. Do not climb a closed stepladder. Make sure that both sides of the  stepladder are locked into place. Ask someone to hold it for you, if possible. Clearly mark and make sure that you can see: Any grab bars or handrails. First and last steps. Where the edge of each step is. Use tools that help you move around (mobility aids) if they are needed. These include: Canes. Walkers. Scooters. Crutches. Turn on the lights when you go into a dark area. Replace any light bulbs as soon as they burn out. Set up your furniture so you have a clear path. Avoid moving your furniture around. If any of your floors are uneven, fix them. If there are any pets around you, be aware of where they are. Review your medicines with your doctor. Some medicines can make you feel dizzy. This can increase your chance of falling. Ask your doctor what other things that you can do to help prevent falls. This information is not intended to replace advice given to you by your health care provider. Make sure you discuss any questions you have with your health care provider. Document Released: 02/08/2009 Document Revised: 09/20/2015 Document Reviewed: 05/19/2014 Elsevier Interactive Patient Education  2017 [employer].

## 2022-11-11 NOTE — Progress Notes (Signed)
Subjective:   Alexandra Bowen is a 87 y.o. female who presents for Medicare Annual (Subsequent) preventive examination.  Visit Complete: In person    Review of Systems     Cardiac Risk Factors include: advanced age (>70men, >14 women);dyslipidemia;hypertension;obesity (BMI >30kg/m2)     Objective:    Today's Vitals   11/11/22 1429  BP: (!) 160/80  Pulse: 85  Temp: 97.9 F (36.6 C)  TempSrc: Oral  SpO2: 95%  Weight: 182 lb (82.6 kg)  Height: 5' 4.5" (1.638 m)   Body mass index is 30.76 kg/m.     11/11/2022    2:36 PM 02/10/2022   10:08 AM 01/31/2021    9:42 AM 01/30/2020    1:26 PM 01/28/2019   11:02 AM 01/26/2018    2:39 PM 01/20/2017   11:12 AM  Advanced Directives  Does Patient Have a Medical Advance Directive? Yes Yes Yes Yes Yes Yes Yes  Type of Estate agent of [place];Living will Healthcare Power of [place];Living will Living will;Healthcare Power of [employer] Power of [place];Living will;Out of facility DNR (pink MOST or yellow form) Healthcare Power of [place];Living will Healthcare Power of [place];Living will   Does patient want to make changes to medical advance directive?  Yes (ED - Information included in AVS) Yes (ED - Information included in AVS) No - Patient declined No - Patient declined No - Patient declined Yes (MAU/Ambulatory/Procedural Areas - Information given)  Copy of Healthcare Power of Attorney in Chart? No - copy requested No - copy requested No - copy requested No - copy requested No - copy requested No - copy requested     Current Medications (verified) Outpatient Encounter Medications as of 11/11/2022  Medication Sig   acetaminophen (TYLENOL) 500 MG tablet Take 500 mg by mouth every 6 (six) hours as needed.   amLODipine (NORVASC) 5 MG tablet TAKE 1 TABLET(5 MG) BY MOUTH DAILY   bisoprolol-hydrochlorothiazide (ZIAC) 10-6.25 MG tablet Take 1 tablet by mouth daily.   Calcium Carb-Cholecalciferol (CALCIUM  600+D3 PO) Take 1 tablet by mouth daily.   Multiple Vitamins-Minerals (MULTIVITAMIN WITH MINERALS) tablet Take 1 tablet by mouth daily.     rivaroxaban (XARELTO) 20 MG TABS tablet TAKE 1 TABLET(20 MG) BY MOUTH DAILY WITH SUPPER   simvastatin (ZOCOR) 20 MG tablet TAKE 1 TABLET(20 MG) BY MOUTH AT BEDTIME   docusate sodium (COLACE) 100 MG capsule Take 1 capsule (100 mg total) by mouth 2 (two) times daily. (Patient not taking: Reported on 01/31/2021)   Facility-Administered Encounter Medications as of 11/11/2022  Medication   influenza  inactive virus vaccine (FLUZONE/FLUARIX) injection 0.5 mL    Allergies (verified) Patient has no known allergies.   History: Past Medical History:  Diagnosis Date   Allergy    Arthritis    Basal cell carcinoma (BCC) of nasal tip 02/04/2017   per path report   Breast cancer (HCC)    Ductal carcinoma in situ of left breast 08/2012   WITH COMEDO NECROSIS   Dyslipidemia    Glucose intolerance (impaired glucose tolerance)    Hypertension    WHITE COAT SYNDROME   Obesity    Renal stone    Past Surgical History:  Procedure Laterality Date   BREAST LUMPECTOMY WITH NEEDLE LOCALIZATION Left 10/06/2012   Procedure: LEFT BREAST LUMPECTOMY WITH NEEDLE LOCALIZATION;  Surgeon: Almond Lint, MD;  Location: [facility];  Service: General;  Laterality: Left;  needle localization at 10:30 breast center of [place]  BREAST SURGERY Left 10/06/2012   NL Lumpectomy   BUNIONECTOMY     both feet   CARDIAC CATHETERIZATION  1998   routine-neg   CATARACT EXTRACTION, BILATERAL     COLONOSCOPY     DILATION AND CURETTAGE OF UTERUS  1998   HAMMER TOE SURGERY     both feet   TOTAL KNEE ARTHROPLASTY Right 05/27/2016   Procedure: RIGHT TOTAL KNEE ARTHROPLASTY;  Surgeon: Durene Romans, MD;  Location: [facility];  Service: Orthopedics;  Laterality: Right;   Family History  Problem Relation Age of Onset   Diabetes Mother    Throat cancer Father    Lung cancer Sister     Heart disease Brother    Heart disease Brother    Heart disease Brother    Lung cancer Brother    CVA Brother    Lung cancer Sister    Heart disease Sister    Social History   Socioeconomic History   Marital status: Widowed    Spouse name: Not on file   Number of children: Not on file   Years of education: Not on file   Highest education level: Not on file  Occupational History   Not on file  Tobacco Use   Smoking status: Never   Smokeless tobacco: Never  Vaping Use   Vaping status: Never Used  Substance and Sexual Activity   Alcohol use: No   Drug use: No   Sexual activity: Not Currently  Other Topics Concern   Not on file  Social History Narrative   Not on file   Social Determinants of Health   Financial Resource Strain: Low Risk  (11/11/2022)   Overall Financial Resource Strain (CARDIA)    Difficulty of Paying Living Expenses: Not hard at all  Food Insecurity: No Food Insecurity (11/11/2022)   Hunger Vital Sign    Worried About Running Out of Food in the Last Year: Never true    Ran Out of Food in the Last Year: Never true  Transportation Needs: No Transportation Needs (11/11/2022)   PRAPARE - Administrator, Civil Service (Medical): No    Lack of Transportation (Non-Medical): No  Physical Activity: Insufficiently Active (11/11/2022)   Exercise Vital Sign    Days of Exercise per Week: 7 days    Minutes of Exercise per Session: 20 min  Stress: No Stress Concern Present (11/11/2022)   [employer] of Occupational Health - Occupational Stress Questionnaire    Feeling of Stress : Not at all  Social Connections: Moderately Isolated (11/11/2022)   Social Connection and Isolation Panel [NHANES]    Frequency of Communication with Friends and Family: Three times a week    Frequency of Social Gatherings with Friends and Family: Twice a week    Attends Religious Services: Never    Database administrator or Organizations: Yes    Attends Banker  Meetings: Never    Marital Status: Widowed    Tobacco Counseling Counseling given: Not Answered   Clinical Intake:  Pre-visit preparation completed: Yes  Pain : No/denies pain     Nutritional Status: BMI > 30  Obese Nutritional Risks: None Diabetes: No  How often do you need to have someone help you when you read instructions, pamphlets, or other written materials from your doctor or pharmacy?: 1 - Never  Interpreter Needed?: No  Information entered by :: NAllen LPN   Activities of Daily Living    11/11/2022    2:31 PM 02/10/2022  10:09 AM  In your present state of health, do you have any difficulty performing the following activities:  Hearing? 0 0  Vision? 0 0  Difficulty concentrating or making decisions? 0 0  Walking or climbing stairs? 0 0  Dressing or bathing? 0 0  Doing errands, shopping? 0 0  Preparing Food and eating ? N N  Using the Toilet? N N  In the past six months, have you accidently leaked urine? N N  Do you have problems with loss of bowel control? N N  Managing your Medications? N N  Managing your Finances? N N  Housekeeping or managing your Housekeeping? N N    Patient Care Team: Ronnald Nian, MD as PCP - General (Family Medicine) Quintella Reichert, MD as PCP - Cardiology (Cardiology)  Indicate any recent Medical Services you may have received from other than [facility] providers in the past year (date may be approximate).     Assessment:   This is a routine wellness examination for Preeya.  Hearing/Vision screen Hearing Screening - Comments:: Denies hearing issues Vision Screening - Comments:: Regular eye exams,   Dietary issues and exercise activities discussed:     Goals Addressed             This Visit's Progress    Patient Stated       11/11/2022, denies any goals at this time       Depression Screen    11/11/2022    2:38 PM 02/10/2022   10:06 AM 01/31/2021    9:44 AM 01/30/2020    1:27 PM 01/28/2019   10:41 AM 01/26/2018     2:13 PM 01/20/2017   10:40 AM  PHQ 2/9 Scores  PHQ - 2 Score 0 0 0 0 0 0 0  PHQ- 9 Score 0          Fall Risk    11/11/2022    2:37 PM 02/10/2022   10:05 AM 01/31/2021    9:44 AM 01/30/2020    1:27 PM 01/28/2019   10:42 AM  Fall Risk   Falls in the past year? 0 0 0 0 0  Number falls in past yr: 0 0 0    Injury with Fall? 0 0 0    Risk for fall due to : Medication side effect No Fall Risks No Fall Risks    Follow up Falls prevention discussed;Falls evaluation completed Falls evaluation completed Falls evaluation completed      MEDICARE RISK AT HOME:  Medicare Risk at Home - 11/11/22 1437     Any stairs in or around the home? No    If so, are there any without handrails? No    Home free of loose throw rugs in walkways, pet beds, electrical cords, etc? Yes    Adequate lighting in your home to reduce risk of falls? Yes    Life alert? No    Use of a cane, walker or w/c? No    Grab bars in the bathroom? Yes    Shower chair or bench in shower? Yes    Elevated toilet seat or a handicapped toilet? Yes             TIMED UP AND GO:  Was the test performed?  Yes  Length of time to ambulate 10 feet: 7 sec Gait slow and steady without use of assistive device    Cognitive Function:        11/11/2022    2:39 PM  6CIT  Screen  What Year? 0 points  What month? 0 points  What time? 0 points  Count back from 20 0 points  Months in reverse 0 points  Repeat phrase 2 points  Total Score 2 points    Immunizations Immunization History  Administered Date(s) Administered   Fluad Quad(high Dose 65+) 01/28/2019, 01/30/2020, 01/31/2021, 01/13/2022   Influenza Split 02/15/1996, 02/22/2007, 02/11/2011, 02/10/2012   Influenza Whole 01/12/2008, 02/14/2009, 02/25/2010   Influenza, High Dose Seasonal PF 02/03/2013, 02/06/2014, 01/22/2015, 02/05/2016, 02/02/2017, 01/26/2018   PFIZER Comirnaty(Gray Top)Covid-19 Tri-Sucrose Vaccine 02/10/2022   PFIZER(Purple Top)SARS-COV-2 Vaccination  06/22/2019, 07/20/2019, 01/30/2020   Pfizer Covid-19 Vaccine Bivalent Booster 72yrs & up 01/31/2021   Pneumococcal Conjugate-13 12/13/2015   Pneumococcal Polysaccharide-23 08/25/2005, 12/06/2012   Td 07/26/1996   Tdap 11/04/2006, 01/22/2017   Zoster Recombinant(Shingrix) 01/22/2017, 06/11/2017   Zoster, Live 08/25/2005    TDAP status: Up to date  Flu Vaccine status: Up to date  Pneumococcal vaccine status: Up to date  Covid-19 vaccine status: Completed vaccines  Qualifies for Shingles Vaccine? Yes   Zostavax completed Yes   Shingrix Completed?: Yes  Screening Tests Health Maintenance  Topic Date Due   COVID-19 Vaccine (6 - 2023-24 season) 04/07/2022   INFLUENZA VACCINE  11/27/2022   Medicare Annual Wellness (AWV)  11/11/2023   DTaP/Tdap/Td (4 - Td or Tdap) 01/23/2027   Pneumonia Vaccine 15+ Years old  Completed   DEXA SCAN  Completed   Zoster Vaccines- Shingrix  Completed   HPV VACCINES  Aged Out    Health Maintenance  Health Maintenance Due  Topic Date Due   COVID-19 Vaccine (6 - 2023-24 season) 04/07/2022    Colorectal cancer screening: No longer required.   Mammogram status: No longer required due to age.  Bone Density status: Completed 05/31/2010.   Lung Cancer Screening: (Low Dose CT Chest recommended if Age 19-80 years, 20 pack-year currently smoking OR have quit w/in 15years.) does not qualify.   Lung Cancer Screening Referral: no  Additional Screening:  Hepatitis C Screening: does not qualify;   Vision Screening: Recommended annual ophthalmology exams for early detection of glaucoma and other disorders of the eye. Is the patient up to date with their annual eye exam?  Yes  Who is the provider or what is the name of the office in which the patient attends annual eye exams? [facility] If pt is not established with a provider, would they like to be referred to a provider to establish care? No .   Dental Screening: Recommended annual dental exams for  proper oral hygiene  Diabetic Foot Exam: n/a  Community Resource Referral / Chronic Care Management: CRR required this visit?  No   CCM required this visit?  No     Plan:     I have personally reviewed and noted the following in the patient's chart:   Medical and social history Use of alcohol, tobacco or illicit drugs  Current medications and supplements including opioid prescriptions. Patient is not currently taking opioid prescriptions. Functional ability and status Nutritional status Physical activity Advanced directives List of other physicians Hospitalizations, surgeries, and ER visits in previous 12 months Vitals Screenings to include cognitive, depression, and falls Referrals and appointments  In addition, I have reviewed and discussed with patient certain preventive protocols, quality metrics, and best practice recommendations. A written personalized care plan for preventive services as well as general preventive health recommendations were provided to patient.     Barb Merino, LPN   1/61/0960  After Visit Summary: in person  Nurse Notes: none

## 2023-01-29 ENCOUNTER — Other Ambulatory Visit: Payer: Self-pay | Admitting: Family Medicine

## 2023-01-29 DIAGNOSIS — I10 Essential (primary) hypertension: Secondary | ICD-10-CM

## 2023-01-29 DIAGNOSIS — E785 Hyperlipidemia, unspecified: Secondary | ICD-10-CM

## 2023-02-02 ENCOUNTER — Other Ambulatory Visit: Payer: Self-pay | Admitting: Family Medicine

## 2023-02-02 ENCOUNTER — Other Ambulatory Visit: Payer: Self-pay

## 2023-02-02 DIAGNOSIS — E785 Hyperlipidemia, unspecified: Secondary | ICD-10-CM

## 2023-02-02 DIAGNOSIS — I10 Essential (primary) hypertension: Secondary | ICD-10-CM

## 2023-02-02 MED ORDER — BISOPROLOL-HYDROCHLOROTHIAZIDE 10-6.25 MG PO TABS
1.0000 | ORAL_TABLET | Freq: Every day | ORAL | 0 refills | Status: DC
Start: 2023-02-02 — End: 2023-02-24

## 2023-02-02 MED ORDER — AMLODIPINE BESYLATE 5 MG PO TABS
ORAL_TABLET | ORAL | 0 refills | Status: DC
Start: 2023-02-02 — End: 2023-02-24

## 2023-02-02 MED ORDER — SIMVASTATIN 20 MG PO TABS: ORAL_TABLET | ORAL | 0 refills | Status: DC

## 2023-02-09 ENCOUNTER — Other Ambulatory Visit (INDEPENDENT_AMBULATORY_CARE_PROVIDER_SITE_OTHER): Payer: Medicare HMO

## 2023-02-09 DIAGNOSIS — Z23 Encounter for immunization: Secondary | ICD-10-CM | POA: Diagnosis not present

## 2023-02-24 ENCOUNTER — Ambulatory Visit (INDEPENDENT_AMBULATORY_CARE_PROVIDER_SITE_OTHER): Payer: Medicare HMO | Admitting: Family Medicine

## 2023-02-24 ENCOUNTER — Encounter: Payer: Self-pay | Admitting: Family Medicine

## 2023-02-24 VITALS — BP 128/68 | HR 73 | Ht 64.5 in | Wt 182.8 lb

## 2023-02-24 DIAGNOSIS — D692 Other nonthrombocytopenic purpura: Secondary | ICD-10-CM

## 2023-02-24 DIAGNOSIS — Z9849 Cataract extraction status, unspecified eye: Secondary | ICD-10-CM | POA: Diagnosis not present

## 2023-02-24 DIAGNOSIS — E669 Obesity, unspecified: Secondary | ICD-10-CM | POA: Diagnosis not present

## 2023-02-24 DIAGNOSIS — C50212 Malignant neoplasm of upper-inner quadrant of left female breast: Secondary | ICD-10-CM

## 2023-02-24 DIAGNOSIS — Z8679 Personal history of other diseases of the circulatory system: Secondary | ICD-10-CM | POA: Insufficient documentation

## 2023-02-24 DIAGNOSIS — E7439 Other disorders of intestinal carbohydrate absorption: Secondary | ICD-10-CM

## 2023-02-24 DIAGNOSIS — I10 Essential (primary) hypertension: Secondary | ICD-10-CM | POA: Diagnosis not present

## 2023-02-24 DIAGNOSIS — M1990 Unspecified osteoarthritis, unspecified site: Secondary | ICD-10-CM

## 2023-02-24 DIAGNOSIS — Z0000 Encounter for general adult medical examination without abnormal findings: Secondary | ICD-10-CM | POA: Diagnosis not present

## 2023-02-24 DIAGNOSIS — E785 Hyperlipidemia, unspecified: Secondary | ICD-10-CM | POA: Diagnosis not present

## 2023-02-24 DIAGNOSIS — I4891 Unspecified atrial fibrillation: Secondary | ICD-10-CM

## 2023-02-24 DIAGNOSIS — R7303 Prediabetes: Secondary | ICD-10-CM

## 2023-02-24 DIAGNOSIS — M8580 Other specified disorders of bone density and structure, unspecified site: Secondary | ICD-10-CM

## 2023-02-24 LAB — POCT GLYCOSYLATED HEMOGLOBIN (HGB A1C): Hemoglobin A1C: 6 % — AB (ref 4.0–5.6)

## 2023-02-24 MED ORDER — SIMVASTATIN 20 MG PO TABS: ORAL_TABLET | ORAL | 0 refills | Status: DC

## 2023-02-24 MED ORDER — BISOPROLOL-HYDROCHLOROTHIAZIDE 10-6.25 MG PO TABS
1.0000 | ORAL_TABLET | Freq: Every day | ORAL | 0 refills | Status: DC
Start: 2023-02-24 — End: 2023-07-27

## 2023-02-24 MED ORDER — RIVAROXABAN 20 MG PO TABS: ORAL_TABLET | ORAL | 0 refills | Status: DC

## 2023-02-24 MED ORDER — AMLODIPINE BESYLATE 5 MG PO TABS
ORAL_TABLET | ORAL | 0 refills | Status: DC
Start: 2023-02-24 — End: 2023-07-27

## 2023-02-24 NOTE — Progress Notes (Signed)
Complete physical exam  Patient: Alexandra Bowen   DOB: 1935-06-14   87 y.o. Female  MRN: [id number]  Subjective:    Chief Complaint  Patient presents with   Annual Exam    CPE, had AWV 11/11/22.No new concerns.     Alexandra Bowen is a 87 y.o. female who presents today for a complete physical exam. She reports consuming a general diet.  Walks daily and does bike. Moves arms up and down when she is sitting.   She generally feels well. She reports sleeping fairly well. She does not have additional problems to discuss today.    Most recent fall risk assessment:    11/11/2022    2:37 PM  Fall Risk   Falls in the past year? 0  Number falls in past yr: 0  Injury with Fall? 0  Risk for fall due to : Medication side effect  Follow up Falls prevention discussed;Falls evaluation completed     Most recent depression screenings:    11/11/2022    2:38 PM 02/10/2022   10:06 AM  PHQ 2/9 Scores  PHQ - 2 Score 0 0  PHQ- 9 Score 0     Vision:Not within last year     Patient Care Team: Ronnald Nian, MD as PCP - General (Family Medicine) Quintella Reichert, MD as PCP - Cardiology (Cardiology) [facility], [place] Irene Limbo, DDS as Referring Physician (Dentistry)    Outpatient Medications Prior to Visit  Medication Sig Note   Calcium Carb-Cholecalciferol (CALCIUM 600+D3 PO) Take 1 tablet by mouth daily.    Multiple Vitamins-Minerals (MULTIVITAMIN WITH MINERALS) tablet Take 1 tablet by mouth daily.      [DISCONTINUED] amLODipine (NORVASC) 5 MG tablet TAKE 1 TABLET(5 MG) BY MOUTH DAILY    [DISCONTINUED] bisoprolol-hydrochlorothiazide (ZIAC) 10-6.25 MG tablet Take 1 tablet by mouth daily.    [DISCONTINUED] rivaroxaban (XARELTO) 20 MG TABS tablet TAKE 1 TABLET(20 MG) BY MOUTH DAILY WITH SUPPER    [DISCONTINUED] simvastatin (ZOCOR) 20 MG tablet TAKE 1 TABLET(20 MG) BY MOUTH AT BEDTIME    acetaminophen (TYLENOL) 500 MG tablet Take 500 mg by mouth every 6 (six) hours as needed.  (Patient not taking: Reported on 02/24/2023) 02/24/2023: As needed   docusate sodium (COLACE) 100 MG capsule Take 1 capsule (100 mg total) by mouth 2 (two) times daily. (Patient not taking: Reported on 01/31/2021) 02/24/2023: As needed   Facility-Administered Medications Prior to Visit  Medication Dose Route Frequency Provider   influenza  inactive virus vaccine (FLUZONE/FLUARIX) injection 0.5 mL  0.5 mL Intramuscular Once Ronnald Nian, MD    Review of Systems  All other systems reviewed and are negative.         Objective:     BP 128/68   Pulse 73   Ht 5' 4.5" (1.638 m)   Wt 182 lb 12.8 oz (82.9 kg)   SpO2 97%   BMI 30.89 kg/m    Physical Exam   Alert and in no distress. Tympanic membranes and canals are normal. Pharyngeal area is normal. Neck is supple without adenopathy or thyromegaly. Cardiac exam shows a regular sinus rhythm without murmurs or gallops. Lungs are clear to auscultation.  Hemoglobin A1c is 6.0 Assessment & Plan:    Routine general medical examination at a health care facility - Plan: CBC with Differential/Platelet, Comprehensive metabolic panel, Lipid panel  Arthritis  Malignant neoplasm of upper-inner quadrant of left female breast, unspecified estrogen receptor status (HCC)  Glucose  intolerance  History of cataract extraction, unspecified laterality  Hyperlipidemia LDL goal <100 - Plan: simvastatin (ZOCOR) 20 MG tablet, Lipid panel  Hypertension, unspecified type - Plan: CBC with Differential/Platelet, Comprehensive metabolic panel  Obesity (BMI 30-39.9) - Plan: CBC with Differential/Platelet, Comprehensive metabolic panel, Lipid panel  Osteopenia, unspecified location  Senile purpura (HCC)  History of chronic atrial fibrillation  Essential hypertension - Plan: bisoprolol-hydrochlorothiazide (ZIAC) 10-6.25 MG tablet, amLODipine (NORVASC) 5 MG tablet  Atrial fibrillation, unspecified type (HCC) - Plan: rivaroxaban (XARELTO) 20 MG TABS  tablet  Pre-diabetes - Plan: POCT glycosylated hemoglobin (Hb A1C)  Immunization History  Administered Date(s) Administered   Fluad Quad(high Dose 65+) 01/28/2019, 01/30/2020, 01/31/2021, 01/13/2022   Fluad Trivalent(High Dose 65+) 02/09/2023   Influenza Split 02/15/1996, 02/22/2007, 02/11/2011, 02/10/2012   Influenza Whole 01/12/2008, 02/14/2009, 02/25/2010   Influenza, High Dose Seasonal PF 02/03/2013, 02/06/2014, 01/22/2015, 02/05/2016, 02/02/2017, 01/26/2018   PFIZER Comirnaty(Gray Top)Covid-19 Tri-Sucrose Vaccine 02/10/2022   PFIZER(Purple Top)SARS-COV-2 Vaccination 06/22/2019, 07/20/2019, 01/30/2020   Pfizer Covid-19 Vaccine Bivalent Booster 10yrs & up 01/31/2021   Pfizer(Comirnaty)Fall Seasonal Vaccine 12 years and older 02/09/2023   Pneumococcal Conjugate-13 12/13/2015   Pneumococcal Polysaccharide-23 08/25/2005, 12/06/2012   Td 07/26/1996   Tdap 11/04/2006, 01/22/2017   Zoster Recombinant(Shingrix) 01/22/2017, 06/11/2017   Zoster, Live 08/25/2005    Health Maintenance  Topic Date Due   COVID-19 Vaccine (7 - 2023-24 season) 04/06/2023   Medicare Annual Wellness (AWV)  11/11/2023   DTaP/Tdap/Td (4 - Td or Tdap) 01/23/2027   Pneumonia Vaccine 37+ Years old  Completed   INFLUENZA VACCINE  Completed   DEXA SCAN  Completed   Zoster Vaccines- Shingrix  Completed   HPV VACCINES  Aged Out  Discussed the risk for diabetes  discussed her risk for diabetes and what hemoglobin A1c is.  She is not interested in doing further DEXA scanning.  Discussed health benefits of physical activity, and encouraged her to engage in regular exercise appropriate for her age and condition.  Problem List Items Addressed This Visit     Obesity (BMI 30-39.9) (Chronic)   Relevant Orders   CBC with Differential/Platelet   Comprehensive metabolic panel   Lipid panel   Arthritis   Atrial fibrillation (HCC)   Relevant Medications   bisoprolol-hydrochlorothiazide (ZIAC) 10-6.25 MG tablet    simvastatin (ZOCOR) 20 MG tablet   rivaroxaban (XARELTO) 20 MG TABS tablet   amLODipine (NORVASC) 5 MG tablet   Breast cancer of upper-inner quadrant of left female breast (HCC)   Glucose intolerance   History of chronic atrial fibrillation   Hx of cataract surgery   Hyperlipidemia LDL goal <100   Relevant Medications   bisoprolol-hydrochlorothiazide (ZIAC) 10-6.25 MG tablet   simvastatin (ZOCOR) 20 MG tablet   rivaroxaban (XARELTO) 20 MG TABS tablet   amLODipine (NORVASC) 5 MG tablet   Other Relevant Orders   Lipid panel   Hypertension   Relevant Medications   bisoprolol-hydrochlorothiazide (ZIAC) 10-6.25 MG tablet   simvastatin (ZOCOR) 20 MG tablet   rivaroxaban (XARELTO) 20 MG TABS tablet   amLODipine (NORVASC) 5 MG tablet   Other Relevant Orders   CBC with Differential/Platelet   Comprehensive metabolic panel   Osteopenia   Senile purpura (HCC)   Relevant Medications   bisoprolol-hydrochlorothiazide (ZIAC) 10-6.25 MG tablet   simvastatin (ZOCOR) 20 MG tablet   rivaroxaban (XARELTO) 20 MG TABS tablet   amLODipine (NORVASC) 5 MG tablet   Other Visit Diagnoses     Routine general medical examination at a health care  facility    -  Primary   Relevant Orders   CBC with Differential/Platelet   Comprehensive metabolic panel   Lipid panel   Essential hypertension       Relevant Medications   bisoprolol-hydrochlorothiazide (ZIAC) 10-6.25 MG tablet   simvastatin (ZOCOR) 20 MG tablet   rivaroxaban (XARELTO) 20 MG TABS tablet   amLODipine (NORVASC) 5 MG tablet   Pre-diabetes       Relevant Orders   POCT glycosylated hemoglobin (Hb A1C)      Follow-up in 6 months.     Sharlot Gowda, MD

## 2023-02-25 ENCOUNTER — Telehealth: Payer: Self-pay | Admitting: Family Medicine

## 2023-02-25 LAB — CBC WITH DIFFERENTIAL/PLATELET
Basophils Absolute: 0 10*3/uL (ref 0.0–0.2)
Basos: 0 %
EOS (ABSOLUTE): 0.3 10*3/uL (ref 0.0–0.4)
Eos: 4 %
Hematocrit: 42.4 % (ref 34.0–46.6)
Hemoglobin: 13.7 g/dL (ref 11.1–15.9)
Immature Grans (Abs): 0 10*3/uL (ref 0.0–0.1)
Immature Granulocytes: 0 %
Lymphocytes Absolute: 1.4 10*3/uL (ref 0.7–3.1)
Lymphs: 20 %
MCH: 27.9 pg (ref 26.6–33.0)
MCHC: 32.3 g/dL (ref 31.5–35.7)
MCV: 86 fL (ref 79–97)
Monocytes Absolute: 0.5 10*3/uL (ref 0.1–0.9)
Monocytes: 6 %
Neutrophils Absolute: 5 10*3/uL (ref 1.4–7.0)
Neutrophils: 70 %
Platelets: 184 10*3/uL (ref 150–450)
RBC: 4.91 x10E6/uL (ref 3.77–5.28)
RDW: 13.4 % (ref 11.7–15.4)
WBC: 7.2 10*3/uL (ref 3.4–10.8)

## 2023-02-25 LAB — COMPREHENSIVE METABOLIC PANEL
ALT: 16 [IU]/L (ref 0–32)
AST: 24 [IU]/L (ref 0–40)
Albumin: 4.5 g/dL (ref 3.7–4.7)
Alkaline Phosphatase: 118 IU/L (ref 44–121)
BUN/Creatinine Ratio: 14 (ref 12–28)
BUN: 11 mg/dL (ref 8–27)
Bilirubin Total: 0.7 mg/dL (ref 0.0–1.2)
CO2: 24 mmol/L (ref 20–29)
Calcium: 9.5 mg/dL (ref 8.7–10.3)
Chloride: 102 mmol/L (ref 96–106)
Creatinine, Ser: 0.77 mg/dL (ref 0.57–1.00)
Globulin, Total: 2.3 g/dL (ref 1.5–4.5)
Glucose: 119 mg/dL — ABNORMAL HIGH (ref 70–99)
Potassium: 3.9 mmol/L (ref 3.5–5.2)
Sodium: 142 mmol/L (ref 134–144)
Total Protein: 6.8 g/dL (ref 6.0–8.5)
eGFR: 76 mL/min/{1.73_m2} (ref >59)

## 2023-02-25 LAB — LIPID PANEL
Chol/HDL Ratio: 2.7 ratio (ref 0.0–4.4)
Cholesterol, Total: 153 mg/dL (ref 100–199)
HDL: 57 mg/dL (ref >39)
LDL Chol Calc (NIH): 80 mg/dL (ref 0–99)
Triglycerides: 83 mg/dL (ref 0–149)
VLDL Cholesterol Cal: 16 mg/dL (ref 5–40)

## 2023-02-25 NOTE — Telephone Encounter (Signed)
Informed pt of lab results  

## 2023-05-30 ENCOUNTER — Other Ambulatory Visit: Payer: Self-pay | Admitting: Family Medicine

## 2023-05-30 DIAGNOSIS — I4891 Unspecified atrial fibrillation: Secondary | ICD-10-CM

## 2023-06-03 ENCOUNTER — Other Ambulatory Visit: Payer: Self-pay

## 2023-06-03 DIAGNOSIS — I4891 Unspecified atrial fibrillation: Secondary | ICD-10-CM

## 2023-06-03 MED ORDER — RIVAROXABAN 20 MG PO TABS: 20.0000 mg | ORAL_TABLET | Freq: Every day | ORAL | 2 refills | Status: DC

## 2023-07-26 ENCOUNTER — Other Ambulatory Visit: Payer: Self-pay | Admitting: Family Medicine

## 2023-07-26 DIAGNOSIS — E785 Hyperlipidemia, unspecified: Secondary | ICD-10-CM

## 2023-07-26 DIAGNOSIS — I10 Essential (primary) hypertension: Secondary | ICD-10-CM

## 2023-11-10 ENCOUNTER — Ambulatory Visit: Admitting: Cardiology

## 2023-11-11 ENCOUNTER — Telehealth: Payer: Self-pay | Admitting: Family Medicine

## 2023-11-11 NOTE — Telephone Encounter (Signed)
 I attempted to call patient but the phone wont ring, please confirm phone number if pt calls back.   Copied from CRM [id number]. Topic: Appointments - Scheduling Inquiry for Clinic >> Nov 11, 2023  3:32 PM Graeme ORN wrote: Reason for CRM: Patient called. States she had an emergency and need to reschedule appt. Appt was 7/22 for AWV in office. Patient also has medicare well visit in October with Dr Vita. Next available AWV showing for December. Patient would like to have completed in August. Thank You    ----------------------------------------------------------------------- From previous Reason for Contact - Cancel/Reschedule: Patient/patient representative is calling to cancel or reschedule an appointment. Refer to attachments for appointment information.

## 2024-01-15 ENCOUNTER — Ambulatory Visit: Attending: Cardiology | Admitting: Cardiology

## 2024-01-15 ENCOUNTER — Encounter: Payer: Self-pay | Admitting: Cardiology

## 2024-01-15 VITALS — BP 156/88 | HR 68 | Ht 66.0 in | Wt 181.8 lb

## 2024-01-15 DIAGNOSIS — I272 Pulmonary hypertension, unspecified: Secondary | ICD-10-CM

## 2024-01-15 DIAGNOSIS — I340 Nonrheumatic mitral (valve) insufficiency: Secondary | ICD-10-CM

## 2024-01-15 DIAGNOSIS — I071 Rheumatic tricuspid insufficiency: Secondary | ICD-10-CM | POA: Diagnosis not present

## 2024-01-15 DIAGNOSIS — I482 Chronic atrial fibrillation, unspecified: Secondary | ICD-10-CM

## 2024-01-15 DIAGNOSIS — E785 Hyperlipidemia, unspecified: Secondary | ICD-10-CM | POA: Diagnosis not present

## 2024-01-15 DIAGNOSIS — I10 Essential (primary) hypertension: Secondary | ICD-10-CM

## 2024-01-15 NOTE — Patient Instructions (Signed)
 Medication Instructions:  Your physician recommends that you continue on your current medications as directed. Please refer to the Current Medication list given to you today.  *If you need a refill on your cardiac medications before your next appointment, please call your pharmacy*  Lab Work: None. If you have labs (blood work) drawn today and your tests are completely normal, you will receive your results only by: MyChart Message (if you have MyChart) OR A paper copy in the mail If you have any lab test that is abnormal or we need to change your treatment, we will call you to review the results.  Testing/Procedures: Your physician has requested that you have an echocardiogram in November/December 2025. Echocardiography is a painless test that uses sound waves to create images of your heart. It provides your doctor with information about the size and shape of your heart and how well your heart's chambers and valves are working. This procedure takes approximately one hour. There are no restrictions for this procedure. Please do NOT wear cologne, perfume, aftershave, or lotions (deodorant is allowed). Please arrive 15 minutes prior to your appointment time.  Please note: We ask at that you not bring children with you during ultrasound (echo/ vascular) testing. Due to room size and safety concerns, children are not allowed in the ultrasound rooms during exams. Our front office staff cannot provide observation of children in our lobby area while testing is being conducted. An adult accompanying a patient to their appointment will only be allowed in the ultrasound room at the discretion of the ultrasound technician under special circumstances. We apologize for any inconvenience.   Follow-Up: At [facility], you and your health needs are our priority.  As part of our continuing mission to provide you with exceptional heart care, our providers are all part of one team.  This team includes  your primary Cardiologist (physician) and Advanced Practice Providers or APPs (Physician Assistants and Nurse Practitioners) who all work together to provide you with the care you need, when you need it.  Your next appointment:   1 year(s)  Provider:   Wilbert Bihari, MD    We recommend signing up for the patient portal called MyChart.  Sign up information is provided on this After Visit Summary.  MyChart is used to connect with patients for Virtual Visits (Telemedicine).  Patients are able to view lab/test results, encounter notes, upcoming appointments, etc.  Non-urgent messages can be sent to your provider as well.   To learn more about what you can do with MyChart, go to [email or web address].   Other Instructions Please check your blood pressure twice a day for one week. Check your blood pressure once at lunch and once at dinner. Write down your blood pressure readings along with the date and time of each reading, as well as a heart rate if your machine provides that information. Then, drop off your readings at our front desk, send a picture of the log over Mychart or send the readings to us  in the mail. You can also call our main phone number [phone]) as all of our operators are trained to take down your blood pressure readings.

## 2024-01-15 NOTE — Addendum Note (Signed)
 Addended by: JANIT GENI CROME on: 01/15/2024 12:17 PM   Modules accepted: Orders

## 2024-01-15 NOTE — Progress Notes (Signed)
 d Cardiology Office  Note:    Date:  01/15/2024   ID:  Alexandra Bowen, DOB [birth year], MRN [id number]  PCP:  Joyce Norleen BROCKS, MD  Cardiologist:  Wilbert Bihari, MD    Referring MD: Joyce Norleen BROCKS, MD   Chief Complaint  Patient presents with   Atrial Fibrillation   Hypertension   Mitral Regurgitation    History of Present Illness:    Alexandra Bowen is a 88 y.o. female with a hx of HTN, paroxysmal atrial fibrillation on chronic anticoagulation with DOAC, Fm Hx of CAD and HLD.  She was a newly seen by me back in 2020 for new onset atrial fibrillation.  Rate versus rhythm control was discussed but she did not want to pursue cardioversion and wanted to pursue rate control since she was asymptomatic.  2D echo was done which demonstrated normal LV function EF 65 to 70% with mild LVH, moderate pulmonary hypertension with PASP 47 mmHg, severe biatrial enlargement, mild MR and mild to moderate TR.  She has a remote history of stress test in 2018 showing no ischemia and EF 71%.    She is back today for follow-up and is doing well.  She denies any chest pain or pressure, SOB, DOE, PND, orthopnea, dizziness, palpitations or syncope.  She occasionally has some mild LE edema when she has been working out in her yard.   Past Medical History:  Diagnosis Date   Allergy    Arthritis    Basal cell carcinoma (BCC) of nasal tip 02/04/2017   per path report   Breast cancer (HCC)    Ductal carcinoma in situ of left breast 08/2012   WITH COMEDO NECROSIS   Dyslipidemia    Glucose intolerance (impaired glucose tolerance)    Hypertension    WHITE COAT SYNDROME   Obesity    Renal stone     Past Surgical History:  Procedure Laterality Date   BREAST LUMPECTOMY WITH NEEDLE LOCALIZATION Left 10/06/2012   Procedure: LEFT BREAST LUMPECTOMY WITH NEEDLE LOCALIZATION;  Surgeon: Jina Nephew, MD;  Location: [facility];  Service: General;  Laterality: Left;  needle localization at 10:30 breast  center of [place]    BREAST SURGERY Left 10/06/2012   NL Lumpectomy   BUNIONECTOMY     both feet   CARDIAC CATHETERIZATION  1998   routine-neg   CATARACT EXTRACTION, BILATERAL     COLONOSCOPY     DILATION AND CURETTAGE OF UTERUS  1998   HAMMER TOE SURGERY     both feet   TOTAL KNEE ARTHROPLASTY Right 05/27/2016   Procedure: RIGHT TOTAL KNEE ARTHROPLASTY;  Surgeon: Donnice Car, MD;  Location: [facility];  Service: Orthopedics;  Laterality: Right;    Current Medications: Current Meds  Medication Sig   acetaminophen  (TYLENOL ) 500 MG tablet Take 500 mg by mouth every 6 (six) hours as needed.   amLODipine  (NORVASC ) 5 MG tablet TAKE 1 TABLET(5 MG) BY MOUTH DAILY   bisoprolol -hydrochlorothiazide  (ZIAC ) 10-6.25 MG tablet TAKE 1 TABLET BY MOUTH DAILY   Calcium Carb-Cholecalciferol (CALCIUM 600+D3 PO) Take 1 tablet by mouth daily.   Multiple Vitamins-Minerals (MULTIVITAMIN WITH MINERALS) tablet Take 1 tablet by mouth daily.     rivaroxaban  (XARELTO ) 20 MG TABS tablet Take 1 tablet (20 mg total) by mouth daily with supper.   simvastatin  (ZOCOR ) 20 MG tablet TAKE 1 TABLET(20 MG) BY MOUTH AT BEDTIME     Allergies:   Patient has no known allergies.   Social History  Socioeconomic History   Marital status: Widowed    Spouse name: Not on file   Number of children: Not on file   Years of education: Not on file   Highest education level: Not on file  Occupational History   Not on file  Tobacco Use   Smoking status: Never   Smokeless tobacco: Never  Vaping Use   Vaping status: Never Used  Substance and Sexual Activity   Alcohol use: No   Drug use: No   Sexual activity: Not Currently  Other Topics Concern   Not on file  Social History Narrative   Not on file   Social Drivers of Health   Financial Resource Strain: Low Risk  (11/11/2022)   Overall Financial Resource Strain (CARDIA)    Difficulty of Paying Living Expenses: Not hard at all  Food Insecurity: No Food Insecurity (11/11/2022)    Hunger Vital Sign    Worried About Running Out of Food in the Last Year: Never true    Ran Out of Food in the Last Year: Never true  Transportation Needs: No Transportation Needs (11/11/2022)   PRAPARE - Administrator, Civil Service (Medical): No    Lack of Transportation (Non-Medical): No  Physical Activity: Insufficiently Active (11/11/2022)   Exercise Vital Sign    Days of Exercise per Week: 7 days    Minutes of Exercise per Session: 20 min  Stress: No Stress Concern Present (11/11/2022)   [employer] of Occupational Health - Occupational Stress Questionnaire    Feeling of Stress : Not at all  Social Connections: Moderately Isolated (11/11/2022)   Social Connection and Isolation Panel    Frequency of Communication with Friends and Family: Three times a week    Frequency of Social Gatherings with Friends and Family: Twice a week    Attends Religious Services: Never    Database administrator or Organizations: Yes    Attends Banker Meetings: Never    Marital Status: Widowed     Family History: The patient's family history includes CVA in her brother; Diabetes in her mother; Heart disease in her brother, brother, brother, and sister; Lung cancer in her brother, sister, and sister; Throat cancer in her father.  ROS:   Please see the history of present illness.    Review of Systems  Respiratory:  Positive for snoring.     All other systems reviewed and negative.   EKGs/Labs/Other Studies Reviewed:    The following studies were reviewed today: EKG, labs, office notes  EKG Interpretation Date/Time:  Friday January 15 2024 11:51:33 EDT Ventricular Rate:  68 PR Interval:    QRS Duration:  92 QT Interval:  412 QTC Calculation: 438 R Axis:   27  Text Interpretation: Atrial fibrillation Nonspecific ST and T wave abnormality When compared with ECG of 04-Oct-2012 10:32, No significant change since last tracing Confirmed by Shlomo Corning ([id number]) on  01/15/2024 12:01:42 PM    Recent Labs: 02/24/2023: ALT 16; BUN 11; Creatinine, Ser 0.77; Hemoglobin 13.7; Platelets 184; Potassium 3.9; Sodium 142   Recent Lipid Panel    Component Value Date/Time   CHOL 153 02/24/2023 1057   TRIG 83 02/24/2023 1057   HDL 57 02/24/2023 1057   CHOLHDL 2.7 02/24/2023 1057   CHOLHDL 2.6 01/20/2017 1442   VLDL 21 12/13/2015 0001   LDLCALC 80 02/24/2023 1057   LDLCALC 78 01/20/2017 1442    Physical Exam:    VS:  BP (!) 156/88  Pulse 68   Ht 5' 6 (1.676 m)   Wt 181 lb 12.8 oz (82.5 kg)   SpO2 96%   BMI 29.34 kg/m     Wt Readings from Last 3 Encounters:  01/15/24 181 lb 12.8 oz (82.5 kg)  02/24/23 182 lb 12.8 oz (82.9 kg)  11/11/22 182 lb (82.6 kg)    GEN: Well nourished, well developed in no acute distress HEENT: Normal NECK: No JVD; No carotid bruits LYMPHATICS: No lymphadenopathy CARDIAC:irregularly irregular, no murmurs, rubs, gallops RESPIRATORY:  Clear to auscultation without rales, wheezing or rhonchi  ABDOMEN: Soft, non-tender, non-distended MUSCULOSKELETAL:  No edema; No deformity  SKIN: Warm and dry NEUROLOGIC:  Alert and oriented x 3 PSYCHIATRIC:  Normal affect  ASSESSMENT:    1. Chronic atrial fibrillation (HCC)   2. Benign essential HTN   3. Pulmonary hypertension, unspecified (HCC)   4. Hyperlipidemia LDL goal <100   5. Mitral valve insufficiency, unspecified etiology   6. Tricuspid valve insufficiency, unspecified etiology     PLAN:    In order of problems listed above:  Chronic atrial fibrillation -first dx in 01/2019 and was placed on DOAC.  At that point rate versus rhythm control was discussed and since she was asymptomatic she wanted to pursue rate control - Remains in atrial fibrillation with adequate heart rate control - She denies any bleeding problems on DOAC -Continue Ziac  10/6.25 mg daily and Xarelto  20 mg daily with as needed refills -She is getting her labs checked by her PCP in October and I have  asked her to have him send me a copy of her labs  HTN -BP borderline elevated on exam today.  She has white coat HTN. -have asked her to check her blood pressure twice daily for a week and call me with results -Continue amlodipine  5 mg daily, Ziac  10/6.25 mg daily with as needed refills  HLD -followed by PCP -Continue simvastatin  20 mg daily  Pulmonary hypertension -Moderate by echo 2021 -2D echo 06/18/2019 showed PASP 46 mmHg with severe biatrial enlargement and mild to moderate TR/normal RV function -Patient has refused 2D echo on multiple office visits>>she agrees to do it in December  Mitral regurgitation/Tricuspid regurgitation -mild MR and mild to moderate TR by echo 2021 -Patient has refused repeat echo on multiple office visits>>she agrees to get it done later in the year  Medication Adjustments/Labs and Tests Ordered: Current medicines are reviewed at length with the patient today.  Concerns regarding medicines are outlined above.  Orders Placed This Encounter  Procedures   EKG 12-Lead   No orders of the defined types were placed in this encounter.  Follow-up 1 year  Signed, Wilbert Bihari, MD  01/15/2024 12:06 PM    [facility]

## 2024-01-23 ENCOUNTER — Other Ambulatory Visit: Payer: Self-pay | Admitting: Family Medicine

## 2024-01-23 DIAGNOSIS — I10 Essential (primary) hypertension: Secondary | ICD-10-CM

## 2024-01-26 ENCOUNTER — Telehealth: Payer: Self-pay

## 2024-01-26 ENCOUNTER — Other Ambulatory Visit (INDEPENDENT_AMBULATORY_CARE_PROVIDER_SITE_OTHER)

## 2024-01-26 DIAGNOSIS — Z23 Encounter for immunization: Secondary | ICD-10-CM

## 2024-01-26 NOTE — Telephone Encounter (Signed)
 Paper Work Dropped Off: 01-26-24  5th floor  Date:01-26-24  Location of paper:  Dr Dorine mailbox on 5th floor

## 2024-01-29 ENCOUNTER — Other Ambulatory Visit: Payer: Self-pay | Admitting: Family Medicine

## 2024-01-29 DIAGNOSIS — I10 Essential (primary) hypertension: Secondary | ICD-10-CM

## 2024-01-29 DIAGNOSIS — E785 Hyperlipidemia, unspecified: Secondary | ICD-10-CM

## 2024-02-15 ENCOUNTER — Telehealth: Payer: Self-pay | Admitting: *Deleted

## 2024-02-15 MED ORDER — AMLODIPINE BESYLATE 2.5 MG PO TABS: 2.5000 mg | ORAL_TABLET | Freq: Every day | ORAL | 3 refills | Status: AC

## 2024-02-15 NOTE — Telephone Encounter (Signed)
 Received BP log from pt which was reviewed by Dr Shlomo who gave written order to increase Amlodipine  to 7.5 mg daily.  Pt is to check BP twice a day for 1 week then send in the results.  Attempted to contact pt to review instructions.  No answer.  Left message to call back to discuss.

## 2024-02-15 NOTE — Telephone Encounter (Signed)
 Spoke with patient and reviewed information.  RX for Amlodipine  2.5 mg (1) tablet daily sent into [facility] as requested.  Pt is aware to take with a 5 mg tablet daily for a total of 7.5 mg daily.  She did not feel like she could split the 5 mg tablets appropriately.  She will keep a BP log as directed and send it in for Dr Dorine review once completed.

## 2024-02-24 ENCOUNTER — Telehealth: Payer: Self-pay | Admitting: Cardiology

## 2024-02-24 NOTE — Telephone Encounter (Signed)
 Paper Work Dropped Off: Pt dropped off paperwork in large envelope  Date: 02-24-2024  Location of paper:  Dr. Dorine mailbox

## 2024-02-25 ENCOUNTER — Telehealth (HOSPITAL_COMMUNITY): Payer: Self-pay | Admitting: Cardiology

## 2024-02-25 ENCOUNTER — Encounter (HOSPITAL_COMMUNITY): Payer: Self-pay | Admitting: Cardiology
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
## 2024-02-26 ENCOUNTER — Ambulatory Visit: Admitting: Family Medicine

## 2024-02-26 ENCOUNTER — Encounter: Payer: Self-pay | Admitting: Family Medicine

## 2024-02-26 VITALS — BP 126/86 | HR 85 | Ht 64.0 in | Wt 184.0 lb

## 2024-02-26 DIAGNOSIS — Z0000 Encounter for general adult medical examination without abnormal findings: Secondary | ICD-10-CM | POA: Diagnosis not present

## 2024-02-26 DIAGNOSIS — I4891 Unspecified atrial fibrillation: Secondary | ICD-10-CM

## 2024-02-26 DIAGNOSIS — E785 Hyperlipidemia, unspecified: Secondary | ICD-10-CM | POA: Diagnosis not present

## 2024-02-26 DIAGNOSIS — I10 Essential (primary) hypertension: Secondary | ICD-10-CM | POA: Diagnosis not present

## 2024-02-26 DIAGNOSIS — R7303 Prediabetes: Secondary | ICD-10-CM | POA: Diagnosis not present

## 2024-02-26 DIAGNOSIS — Z1231 Encounter for screening mammogram for malignant neoplasm of breast: Secondary | ICD-10-CM | POA: Diagnosis not present

## 2024-02-26 LAB — POCT GLYCOSYLATED HEMOGLOBIN (HGB A1C): Hemoglobin A1C: 6.1 % — AB (ref 4.0–5.6)

## 2024-02-26 MED ORDER — RIVAROXABAN 20 MG PO TABS: 20.0000 mg | ORAL_TABLET | Freq: Every day | ORAL | 2 refills | Status: AC

## 2024-02-26 MED ORDER — SIMVASTATIN 20 MG PO TABS: ORAL_TABLET | ORAL | 0 refills | Status: DC

## 2024-02-26 MED ORDER — AMLODIPINE BESYLATE 5 MG PO TABS: ORAL_TABLET | ORAL | 0 refills | Status: DC

## 2024-02-26 MED ORDER — AMLODIPINE BESYLATE 5 MG PO TABS: ORAL_TABLET | ORAL | 1 refills | Status: AC

## 2024-02-26 MED ORDER — BISOPROLOL-HYDROCHLOROTHIAZIDE 10-6.25 MG PO TABS: 1.0000 | ORAL_TABLET | Freq: Every day | ORAL | 0 refills | Status: DC

## 2024-02-26 NOTE — Progress Notes (Signed)
 Name: Alexandra Bowen   Date of Visit: 02/26/24   Date of last visit with me: Visit date not found   CHIEF COMPLAINT:  Chief Complaint  Patient presents with   Annual Exam    Cpe and Awv.        HPI:  Discussed the use of AI scribe software for clinical note transcription with the patient, who gave verbal consent to proceed.  History of Present Illness   TAJAH NOGUCHI is an 88 year old female who presents for an annual physical exam.  She feels well overall and has received her flu and COVID vaccinations on January 26, 2024.  She has not had a mammogram recently and has chosen not to undergo bone density testing.  Her cardiologist recently adjusted her medication to include an additional 2.5 mg dose along with her regular 5 mg dose, totaling 7.5 mg. She has several refills available for the 2.5 mg dose and requires a refill for the 5 mg dose.  She mentions the high cost of her blood thinner medication, which is typically ordered for 90 days. A 30-day supply at the end of the year was significantly more expensive. She has not experienced any issues with bleeding while on her blood thinner.  She monitors her blood pressure and pulse at home, understanding that a normal blood pressure is under 140/90 mmHg and a normal pulse is between 60 and 100 bpm. She has a home meter for checking her blood pressure and blood sugar levels. Her blood sugar was previously noted as elevated, but she has not been diagnosed with diabetes.         OBJECTIVE:       02/26/2024   10:14 AM  Depression screen PHQ 2/9  Decreased Interest 0  Down, Depressed, Hopeless 0  PHQ - 2 Score 0     BP Readings from Last 3 Encounters:  02/26/24 126/86  01/15/24 (!) 156/88  02/24/23 128/68    BP 126/86   Pulse 85   Ht 5' 4 (1.626 m)   Wt 184 lb (83.5 kg)   SpO2 98%   BMI 31.58 kg/m    Physical Exam          Physical Exam  ASSESSMENT/PLAN:   Assessment & Plan Hyperlipidemia LDL  goal <100  Routine general medical examination at a health care facility  Encounter for Medicare annual wellness exam  Pre-diabetes  Essential hypertension  Atrial fibrillation, unspecified type (HCC)  Breast cancer screening by mammogram    Assessment and Plan    Adult Wellness Visit June is in good health, up to date with vaccinations, and maintains a healthy lifestyle. - Perform annual lab work to assess hematological parameters, liver function, electrolytes, renal function, and lipid profile. - Send lab results to June for her records. -- Full history and exam completed today in addition to [facility] Wellness Visit. Reviewed interval concerns, chronic conditions, and preventive care needs. Physical exam performed. Counseling provided on lifestyle, screenings, vaccines, and routine health maintenance.  -  I reviewed and addressed the Medicare-specific components of today's visit, including preventive services eligibility, screening recommendations, and health maintenance items as applicable.    Atrial fibrillation June is stable on anticoagulation therapy without bleeding issues. She expressed concerns about medication costs. - Refill anticoagulant for 90 days with refills.  Essential hypertension Blood pressure is managed with the current medication regimen, which now includes 5 mg and 2.5 mg antihypertensive medication. - Refill 5 mg antihypertensive  medication at [facility]. - Monitor blood pressure at home, ensuring it remains below 140/90 mmHg.  Hyperlipidemia Cholesterol levels are well-managed with a healthy lifestyle. - Continue annual cholesterol monitoring as part of lab work.  Prediabetes Blood sugar levels have been slightly elevated but not diabetic. She monitors levels at home. - Continue monitoring blood sugar levels at home. - Check blood sugar levels as part of annual lab work.  Screening mammogram (planned) June is open to scheduling a  mammogram after several years without one. - Order screening mammogram and coordinate scheduling with June's preferred location on [address].         Alonzo Owczarzak A. Vita MD [facility]

## 2024-02-27 ENCOUNTER — Ambulatory Visit: Payer: Self-pay | Admitting: Family Medicine

## 2024-02-27 LAB — COMPREHENSIVE METABOLIC PANEL WITH GFR
ALT: 16 IU/L (ref 0–32)
AST: 25 IU/L (ref 0–40)
Albumin: 4.4 g/dL (ref 3.7–4.7)
Alkaline Phosphatase: 117 IU/L (ref 48–129)
BUN/Creatinine Ratio: 18 (ref 12–28)
BUN: 15 mg/dL (ref 8–27)
Bilirubin Total: 0.9 mg/dL (ref 0.0–1.2)
CO2: 25 mmol/L (ref 20–29)
Calcium: 9.3 mg/dL (ref 8.7–10.3)
Chloride: 103 mmol/L (ref 96–106)
Creatinine, Ser: 0.84 mg/dL (ref 0.57–1.00)
Globulin, Total: 2.4 g/dL (ref 1.5–4.5)
Glucose: 121 mg/dL — ABNORMAL HIGH (ref 70–99)
Potassium: 3.9 mmol/L (ref 3.5–5.2)
Sodium: 143 mmol/L (ref 134–144)
Total Protein: 6.8 g/dL (ref 6.0–8.5)
eGFR: 68 mL/min/1.73 (ref >59)

## 2024-02-27 LAB — LIPID PANEL
Chol/HDL Ratio: 2.4 ratio (ref 0.0–4.4)
Cholesterol, Total: 132 mg/dL (ref 100–199)
HDL: 54 mg/dL (ref >39)
LDL Chol Calc (NIH): 62 mg/dL (ref 0–99)
Triglycerides: 85 mg/dL (ref 0–149)
VLDL Cholesterol Cal: 16 mg/dL (ref 5–40)

## 2024-02-27 LAB — CBC WITH DIFFERENTIAL/PLATELET
Basophils Absolute: 0 x10E3/uL (ref 0.0–0.2)
Basos: 0 %
EOS (ABSOLUTE): 0.2 x10E3/uL (ref 0.0–0.4)
Eos: 3 %
Hematocrit: 43.9 % (ref 34.0–46.6)
Hemoglobin: 13.9 g/dL (ref 11.1–15.9)
Immature Grans (Abs): 0 x10E3/uL (ref 0.0–0.1)
Immature Granulocytes: 0 %
Lymphocytes Absolute: 1.4 x10E3/uL (ref 0.7–3.1)
Lymphs: 19 %
MCH: 28.2 pg (ref 26.6–33.0)
MCHC: 31.7 g/dL (ref 31.5–35.7)
MCV: 89 fL (ref 79–97)
Monocytes Absolute: 0.5 x10E3/uL (ref 0.1–0.9)
Monocytes: 6 %
Neutrophils Absolute: 5.5 x10E3/uL (ref 1.4–7.0)
Neutrophils: 72 %
Platelets: 170 x10E3/uL (ref 150–450)
RBC: 4.93 x10E6/uL (ref 3.77–5.28)
RDW: 13.5 % (ref 11.7–15.4)
WBC: 7.6 x10E3/uL (ref 3.4–10.8)

## 2024-02-28 ENCOUNTER — Other Ambulatory Visit: Payer: Self-pay | Admitting: Family Medicine

## 2024-02-28 DIAGNOSIS — E785 Hyperlipidemia, unspecified: Secondary | ICD-10-CM

## 2024-02-28 DIAGNOSIS — I10 Essential (primary) hypertension: Secondary | ICD-10-CM

## 2024-02-29 ENCOUNTER — Telehealth: Payer: Self-pay | Admitting: Family Medicine

## 2024-02-29 NOTE — Telephone Encounter (Signed)
 I have mailed patient a copy of her labs from 02/26/2024   Copied from CRM #[id number]. Topic: Clinical - Lab/Test Results >> Feb 29, 2024  3:24 PM Wess RAMAN wrote: Reason for CRM: Patient would like a copy of her lab results mailed to her

## 2024-03-03 ENCOUNTER — Telehealth: Payer: Self-pay

## 2024-03-03 NOTE — Telephone Encounter (Signed)
 Call to patient to discuss blood pressure log and Dr. Dorine recommendations. No answer. No current DPR on file. Left message with no identifiers asking recipient to call [place] at our office number.

## 2024-03-03 NOTE — Telephone Encounter (Signed)
 Call to patient to discuss rescheduling echo. No answer. No current DPR on file. Left message with no identifiers asking recipient to call [place] at our office number.

## 2024-03-03 NOTE — Telephone Encounter (Signed)
-----   Message from Wilbert Bihari sent at 02/29/2024  9:06 PM EST ----- Normal BPs - continue current meds ----- Message ----- From: Smitty Jonel CROME Sent: 02/29/2024   4:48 PM EST To: Wilbert JONELLE Bihari, MD; Geni CROME Sar, RN

## 2024-03-04 NOTE — Telephone Encounter (Signed)
 Notified patient of lab results  Also she wanted to let us  know that she did not schedule mammogram, she said this was bad time of year for her to do that and also that Dr Joyce had told her that she didn't need to keep doing those at her age

## 2024-03-22 ENCOUNTER — Telehealth: Payer: Self-pay

## 2024-03-22 NOTE — Telephone Encounter (Signed)
-----   Message from Wilbert Bihari sent at 02/29/2024  9:06 PM EST ----- Normal BPs - continue current meds ----- Message ----- From: Smitty Jonel CROME Sent: 02/29/2024   4:48 PM EST To: Wilbert JONELLE Bihari, MD; Geni CROME Sar, RN

## 2024-03-22 NOTE — Telephone Encounter (Signed)
 Call to patient to review normal lab results, no answer. No updated DPR on file for our practice. Left VM asking recipient to call [place] at our office #.

## 2024-03-22 NOTE — Telephone Encounter (Signed)
 Call to patient to discuss rescheduling echo, no answer. No current DPR on file. Left message with no identifiers asking recipient to call [place] at our office number.

## 2024-03-23 NOTE — Telephone Encounter (Signed)
 Call to patient to review normal lab results, no answer. No updated DPR on file for our practice. Left VM asking recipient to call [place] at our office #.  Third attempt, letter sent.

## 2024-04-06 NOTE — Telephone Encounter (Signed)
 Call to patient to discuss rescheduling echo, no answer. No current DPR on file. Left message with no identifiers asking recipient to call [place] at our office number. Third attempt, letter sent.

## 2024-04-13 ENCOUNTER — Ambulatory Visit: Payer: Medicare HMO | Admitting: Family Medicine

## 2025-02-28 ENCOUNTER — Encounter: Admitting: Family Medicine
